# Patient Record
Sex: Male | Born: 1970 | Race: White | Hispanic: No | Marital: Married | Smoking: Never smoker
Health system: Southern US, Community
[De-identification: ages and names within clinical notes are randomized; demographics above are authoritative.]

## PROBLEM LIST (undated history)

## (undated) DIAGNOSIS — C73 Malignant neoplasm of thyroid gland: Secondary | ICD-10-CM

## (undated) HISTORY — PX: VASECTOMY: SHX75

## (undated) HISTORY — PX: KNEE SURGERY: SHX244

## (undated) HISTORY — PX: WISDOM TOOTH EXTRACTION: SHX21

---

## 2018-02-13 ENCOUNTER — Encounter (INDEPENDENT_AMBULATORY_CARE_PROVIDER_SITE_OTHER): Payer: Self-pay | Admitting: Orthopedic Surgery

## 2018-02-13 ENCOUNTER — Ambulatory Visit (INDEPENDENT_AMBULATORY_CARE_PROVIDER_SITE_OTHER): Payer: BLUE CROSS/BLUE SHIELD | Admitting: Orthopedic Surgery

## 2018-02-13 DIAGNOSIS — M79632 Pain in left forearm: Secondary | ICD-10-CM | POA: Diagnosis not present

## 2018-02-13 NOTE — Progress Notes (Signed)
Office Visit Note   [name]           Date of Birth: [birth year]           MRN: [id number] Visit Date: 02/13/2018 Requested by: No referring provider defined for this encounter. PCP: System, Pcp Not In  Subjective: Chief Complaint  Patient presents with  . Arm Pain    left forearm pain    HPI: Ricardo House is a 47 year old patient right-hand-dominant with left forearm pain for several months.  He thinks he may have injured it playing golf.  He feels like he has a muscle strain but he localizes it to the mobile wad area.  Actually is gotten little bit better since he made the appointment.  He denies any loss of dexterity denies any numbness and tingling denies any neck pain.  He has been taking some occasional over-the-counter medication for the problem.              ROS: All systems reviewed are negative as they relate to the chief complaint within the history of present illness.  Patient denies  fevers or chills.   Assessment & Plan: Visit Diagnoses:  1. Left forearm pain     Plan: Impression is muscle strain in the mobile wad area versus low level posterior interosseous nerve syndrome.  Plan is observation for now.  I think in general this is getting better.  Counseled him to avoid repetitive pronation supination type activities.  I will see him back as needed.  Could consider the full work-up of x-rays MRI and nerve study if needed.  For now his symptoms really do not warrant that by mutual agreement.  Follow-Up Instructions: Return if symptoms worsen or fail to improve.   Orders:  No orders of the defined types were placed in this encounter.  No orders of the defined types were placed in this encounter.     Procedures: No procedures performed   Clinical Data: No additional findings.  Objective: Vital Signs: There were no vitals taken for this visit.  Physical Exam:   Constitutional: Patient appears well-developed HEENT:  Head: Normocephalic Eyes:EOM are  normal Neck: Normal range of motion Cardiovascular: Normal rate Pulmonary/chest: Effort normal Neurologic: Patient is alert Skin: Skin is warm Psychiatric: Patient has normal mood and affect    Ortho Exam: Ortho exam demonstrates 5 out of 5 grip EPL FPL interosseous resection extension biceps triceps and deltoid strength.  Good cervical spine range of motion.  Patient is palpable radial pulse.  Does have a little bit of pain with resisted supination on the left but not on the right.  Little bit of tenderness just above the supinator arcade.  Negative Tinel's in the cubital tunnel on the left.  No real tenderness on the medial or lateral epicondyle.  No pain with resisted wrist extension on either side.  No masses lymphadenopathy or skin changes palpable in that left forearm area.  Specialty Comments:  No specialty comments available.  Imaging: No results found.   PMFS History: There are no active problems to display for this patient.  History reviewed. No pertinent past medical history.  History reviewed. No pertinent family history.  History reviewed. No pertinent surgical history. Social History   Occupational History  . Not on file  Tobacco Use  . Smoking status: Not on file  Substance and Sexual Activity  . Alcohol use: Not on file  . Drug use: Not on file  . Sexual activity: Not on file

## 2018-09-01 ENCOUNTER — Ambulatory Visit: Payer: Self-pay

## 2018-09-01 ENCOUNTER — Encounter: Payer: Self-pay | Admitting: Orthopedic Surgery

## 2018-09-01 ENCOUNTER — Ambulatory Visit (INDEPENDENT_AMBULATORY_CARE_PROVIDER_SITE_OTHER): Payer: 59 | Admitting: Orthopedic Surgery

## 2018-09-01 ENCOUNTER — Other Ambulatory Visit: Payer: Self-pay

## 2018-09-01 DIAGNOSIS — M25561 Pain in right knee: Secondary | ICD-10-CM | POA: Diagnosis not present

## 2018-09-01 DIAGNOSIS — M1711 Unilateral primary osteoarthritis, right knee: Secondary | ICD-10-CM

## 2018-09-04 ENCOUNTER — Encounter: Payer: Self-pay | Admitting: Orthopedic Surgery

## 2018-09-04 DIAGNOSIS — M1711 Unilateral primary osteoarthritis, right knee: Secondary | ICD-10-CM | POA: Diagnosis not present

## 2018-09-04 MED ORDER — LIDOCAINE HCL 1 % IJ SOLN
5.0000 mL | INTRAMUSCULAR | Status: AC | PRN
  Administered 2018-09-04: 5 mL

## 2018-09-04 MED ORDER — BUPIVACAINE HCL 0.25 % IJ SOLN
4.0000 mL | INTRAMUSCULAR | Status: AC | PRN
  Administered 2018-09-04: 09:00:00 4 mL via INTRA_ARTICULAR

## 2018-09-04 MED ORDER — METHYLPREDNISOLONE ACETATE 40 MG/ML IJ SUSP
40.0000 mg | INTRAMUSCULAR | Status: AC | PRN
  Administered 2018-09-04: 40 mg via INTRA_ARTICULAR

## 2018-09-04 NOTE — Progress Notes (Signed)
Office Visit Note   [name]           Date of Birth: 1970/07/30           MRN: [id number] Visit Date: 09/01/2018 Requested by: No referring provider defined for this encounter. PCP: System, Pcp Not In  Subjective: Chief Complaint  Patient presents with  . Right Knee - Pain    HPI: Ricardo House is a patient with right knee pain.  He was last seen about 5 years ago for this problem.  He has had prior arthroscopic surgery on that right knee consisting of a lateral release.  He states that about once a month it would pop and he felt like something was bound and then released within the right knee.  Does happen more frequently to the point now it is about 6 times a day.  Reports pain in the superior lateral region.  He has history of arthroscopic surgery and meniscal surgery on that knee x3 about 13 years ago.              ROS: All systems reviewed are negative as they relate to the chief complaint within the history of present illness.  Patient denies  fevers or chills.   Assessment & Plan: Visit Diagnoses:  1. Right knee pain, unspecified chronicity   2. Unilateral primary osteoarthritis, right knee     Plan: Impression is right knee mechanical symptoms and effusion with medial compartment arthritis but most of his pain is around that lateral release site superior laterally.  I think he may have some synovitis or potential mechanical blocks to motion in that region.  Is been going on for over several months.  I would aspirate and inject that knee today and then get an MRI scan of that right knee to evaluate superior lateral mechanical locking pathology.  He is going to call if he wants to get that MRI scan.  We will see how that injection does for now.  Follow-Up Instructions: Return if symptoms worsen or fail to improve.   Orders:  Orders Placed This Encounter  Procedures  . XR Knee 1-2 Views Right   No orders of the defined types were placed in this encounter.     Procedures: Large Joint Inj: R knee on 09/04/2018 9:10 AM Indications: diagnostic evaluation, joint swelling and pain Details: 18 G 1.5 in needle, superolateral approach  Arthrogram: No  Medications: 5 mL lidocaine 1 %; 40 mg methylPREDNISolone acetate 40 MG/ML; 4 mL bupivacaine 0.25 % Outcome: tolerated well, no immediate complications Procedure, treatment alternatives, risks and benefits explained, specific risks discussed. Consent was given by the patient. Immediately prior to procedure a time out was called to verify the correct patient, procedure, equipment, support staff and site/side marked as required. Patient was prepped and draped in the usual sterile fashion.       Clinical Data: No additional findings.  Objective: Vital Signs: There were no vitals taken for this visit.  Physical Exam:   Constitutional: Patient appears well-developed HEENT:  Head: Normocephalic Eyes:EOM are normal Neck: Normal range of motion Cardiovascular: Normal rate Pulmonary/chest: Effort normal Neurologic: Patient is alert Skin: Skin is warm Psychiatric: Patient has normal mood and affect    Ortho Exam: Ortho exam demonstrates full active and passive range of motion of that right knee.  Effusion is present.  He has not much in the way medial lateral joint line tenderness but does hurt some superior laterally.  Extensor mechanism is intact.  [employer]  strength is excellent.  Varus alignment is present.  Specialty Comments:  No specialty comments available.  Imaging: No results found.   PMFS History: There are no active problems to display for this patient.  History reviewed. No pertinent past medical history.  History reviewed. No pertinent family history.  History reviewed. No pertinent surgical history. Social History   Occupational History  . Not on file  Tobacco Use  . Smoking status: Not on file  Substance and Sexual Activity  . Alcohol use: Not on file  . Drug use: Not on  file  . Sexual activity: Not on file

## 2018-11-27 ENCOUNTER — Telehealth: Payer: Self-pay | Admitting: Orthopedic Surgery

## 2018-11-27 DIAGNOSIS — M25561 Pain in right knee: Secondary | ICD-10-CM

## 2018-11-27 NOTE — Telephone Encounter (Signed)
Patent called stated he and DEAN discussed that if cortisone inj didn't work they would do a MRI.He can now feel a bind and release  Please call patient as he is wanting to get MRI the nest step that was offered to him.  [phone]

## 2018-11-27 NOTE — Telephone Encounter (Signed)
S/w patient and advised MRI ordered per Dr Forbes Cellar last [facility] note

## 2018-12-28 ENCOUNTER — Other Ambulatory Visit: Payer: Self-pay

## 2018-12-28 ENCOUNTER — Ambulatory Visit
Admission: RE | Admit: 2018-12-28 | Discharge: 2018-12-28 | Disposition: A | Discharge status: Home / Self Care | Payer: 59 | Source: Ambulatory Visit | Attending: Orthopedic Surgery | Admitting: Orthopedic Surgery

## 2018-12-28 DIAGNOSIS — M25561 Pain in right knee: Secondary | ICD-10-CM

## 2019-01-03 ENCOUNTER — Ambulatory Visit (INDEPENDENT_AMBULATORY_CARE_PROVIDER_SITE_OTHER): Payer: 59 | Admitting: Orthopedic Surgery

## 2019-01-03 ENCOUNTER — Other Ambulatory Visit: Payer: Self-pay

## 2019-01-03 ENCOUNTER — Encounter: Payer: Self-pay | Admitting: Orthopedic Surgery

## 2019-01-03 DIAGNOSIS — M25561 Pain in right knee: Secondary | ICD-10-CM

## 2019-01-03 NOTE — Progress Notes (Signed)
   Office Visit Note   [name]           Date of Birth: 1970/11/06           MRN: [id number] Visit Date: 01/03/2019 Requested by: No referring provider defined for this encounter. PCP: System, Pcp Not In  Subjective: Chief Complaint  Patient presents with  . Follow-up    HPI: Ricardo House is a patient with right knee pain.  Here to follow-up MRI scan.  MRI scan shows medial compartment arthritis with prior lateral retinacular release.  There is some osteophyte over on the lateral femoral condyle which I think is giving him his mechanical symptoms.              ROS: All systems reviewed are negative as they relate to the chief complaint within the history of present illness.  Patient denies  fevers or chills.   Assessment & Plan: Visit Diagnoses: No diagnosis found.  Plan: Impression is right knee pain laterally which is likely residual lateral retinaculum clicking over osteophyte.  The patellofemoral joint itself is relatively well-preserved.  Medial compartment arthritis is present.  I would favor observation at this time.  The injection that he had previously did help him.  The binding and catching laterally has diminished.  Could consider arthroscopic intervention of that osteophyte if absolutely needed but for now he is not doing any structural damage to his knee by walking so we will follow this along  Follow-Up Instructions: Return if symptoms worsen or fail to improve.   Orders:  No orders of the defined types were placed in this encounter.  No orders of the defined types were placed in this encounter.     Procedures: No procedures performed   Clinical Data: No additional findings.  Objective: Vital Signs: There were no vitals taken for this visit.  Physical Exam:   Constitutional: Patient appears well-developed HEENT:  Head: Normocephalic Eyes:EOM are normal Neck: Normal range of motion Cardiovascular: Normal rate Pulmonary/chest: Effort normal  Neurologic: Patient is alert Skin: Skin is warm Psychiatric: Patient has normal mood and affect    Ortho Exam: Ortho exam demonstrates full active and passive range of motion of that right knee with trace effusion.  Collateral crucial ligaments are stable.  Varus alignment present.  I do not really detect any definite mechanical symptoms with every bit of knee flexion which she described occurring last week.  He does produce an occasional pop which is very mild in that right knee lateral region.  It is in the vicinity of the osteophyte which is present.  This is shown to him on the MRI scan.  Specialty Comments:  No specialty comments available.  Imaging: No results found.   PMFS History: There are no active problems to display for this patient.  History reviewed. No pertinent past medical history.  History reviewed. No pertinent family history.  History reviewed. No pertinent surgical history. Social History   Occupational History  . Not on file  Tobacco Use  . Smoking status: Never Smoker  . Smokeless tobacco: Never Used  Substance and Sexual Activity  . Alcohol use: Not on file  . Drug use: Not on file  . Sexual activity: Not on file

## 2019-05-07 ENCOUNTER — Other Ambulatory Visit: Payer: 59

## 2019-05-10 ENCOUNTER — Ambulatory Visit: Payer: 59 | Attending: Internal Medicine

## 2019-05-10 DIAGNOSIS — Z20822 Contact with and (suspected) exposure to covid-19: Secondary | ICD-10-CM

## 2019-05-12 LAB — NOVEL CORONAVIRUS, NAA: SARS-CoV-2, NAA: NOT DETECTED

## 2019-06-01 ENCOUNTER — Ambulatory Visit: Payer: 59 | Attending: Internal Medicine

## 2019-06-01 DIAGNOSIS — Z20822 Contact with and (suspected) exposure to covid-19: Secondary | ICD-10-CM

## 2019-06-02 LAB — NOVEL CORONAVIRUS, NAA: SARS-CoV-2, NAA: NOT DETECTED

## 2019-08-01 ENCOUNTER — Ambulatory Visit: Payer: 59 | Attending: Internal Medicine

## 2019-08-01 DIAGNOSIS — Z20822 Contact with and (suspected) exposure to covid-19: Secondary | ICD-10-CM

## 2019-08-02 LAB — NOVEL CORONAVIRUS, NAA: SARS-CoV-2, NAA: NOT DETECTED

## 2019-08-02 LAB — SARS-COV-2, NAA 2 DAY TAT

## 2019-09-17 IMAGING — MR MR KNEE*R* W/O CM
6 series · 40 of 40 positions shown · non-contrast
Comparison: Radiographs 09/01/2018.  MRI 09/11/2011.

CLINICAL DATA: Lateral knee pain for 6 months. No recent injury.
Knee surgery in 7995 following sports injury.

EXAM:
MRI OF THE RIGHT KNEE WITHOUT CONTRAST
TECHNIQUE: Multiplanar, multisequence MR imaging of the knee was performed. No
intravenous contrast was administered.

[Series 6: T2 fat-sat · axial · right · 4.0mm · 0.53mm/px · z∈[-44,+110]mm · 8 of 36 slices shown (1 of 3)]
[im 1/36]
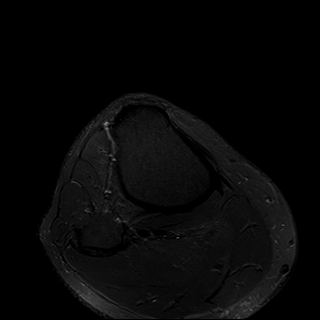
[im 6/36]
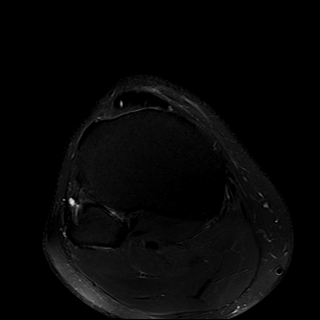
[im 11/36]
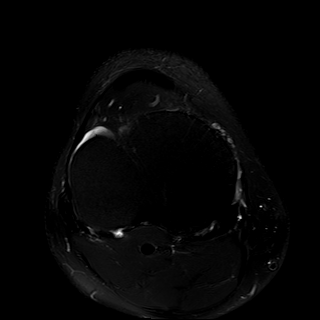
[im 16/36]
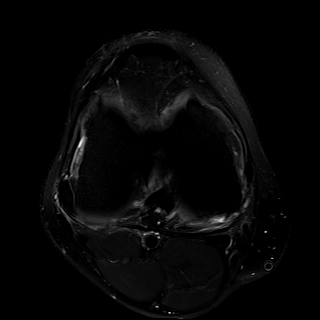
[im 21/36]
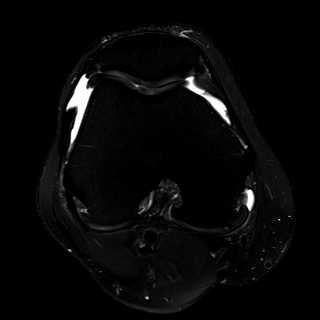
[im 26/36]
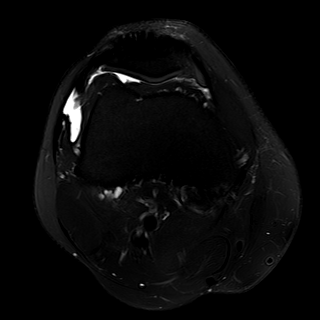
[im 31/36]
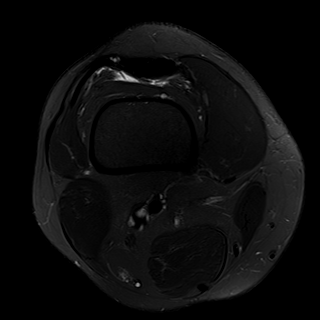
[im 36/36]
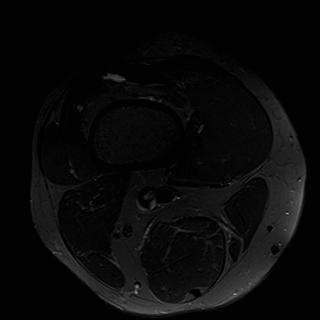

[Series 7: T2 fat-sat · coronal · right · 4.0mm · 0.42mm/px · 6 of 30 slices shown (2 of 3)]
[im 1/30]
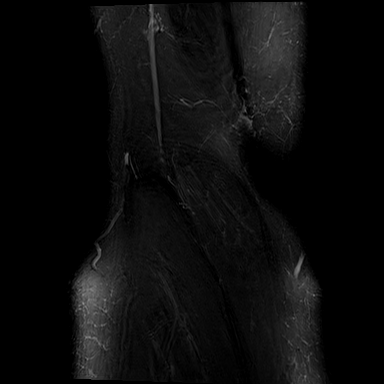
[im 6/30]
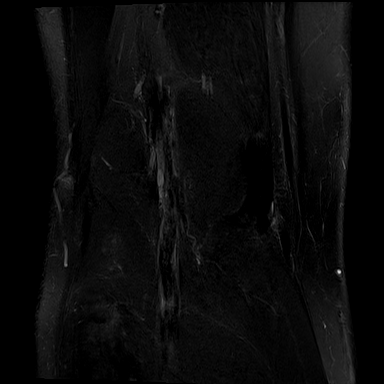
[im 12/30]
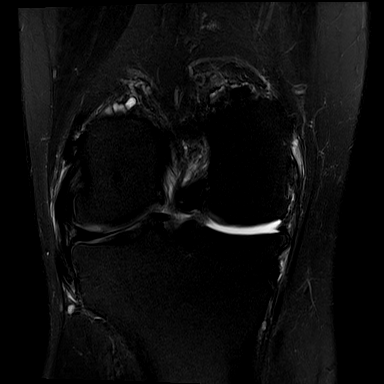
[im 18/30]
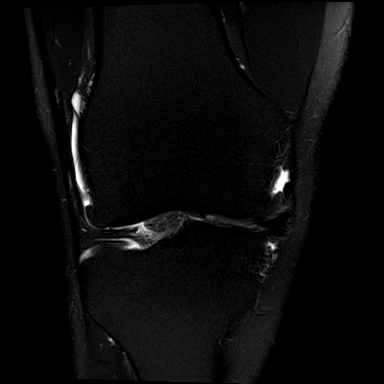
[im 24/30]
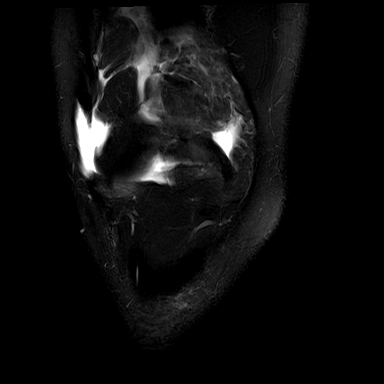
[im 30/30]
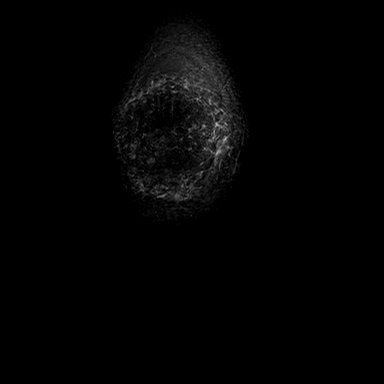

[Series 9: PD fat-sat · coronal · right · 3.0mm · 0.50mm/px · 8 of 36 slices shown (1 of 2)]
[im 1/36]
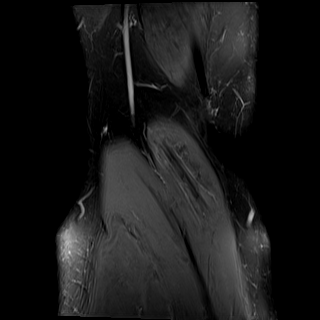
[im 6/36]
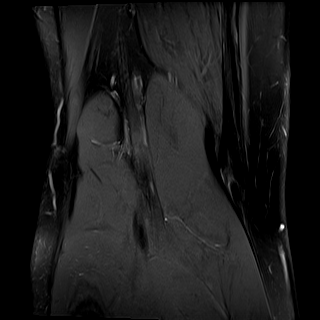
[im 11/36]
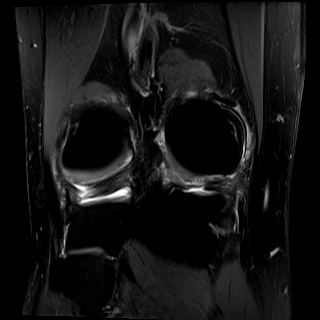
[im 16/36]
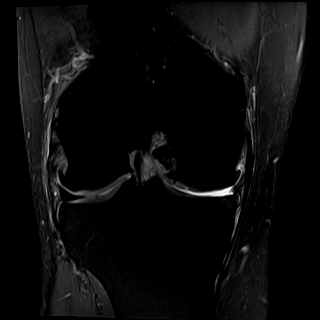
[im 21/36]
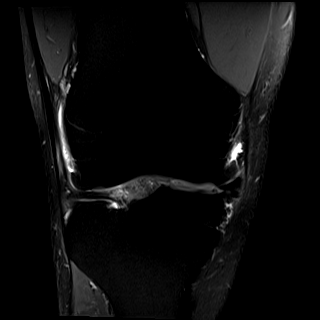
[im 26/36]
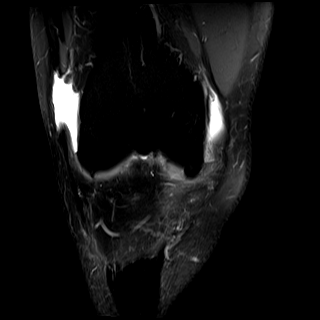
[im 31/36]
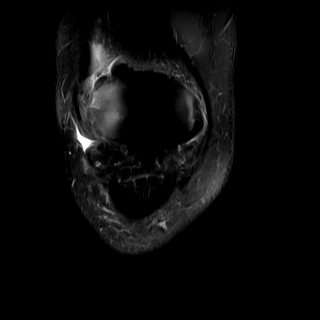
[im 36/36]
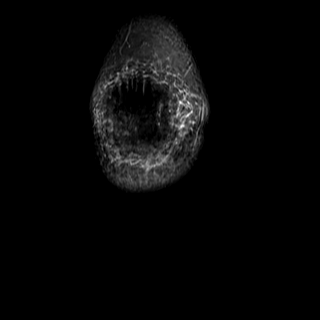

[Series 10: T1 · coronal · right · 4.0mm · 0.42mm/px · 6 of 30 slices shown]
[im 1/30]
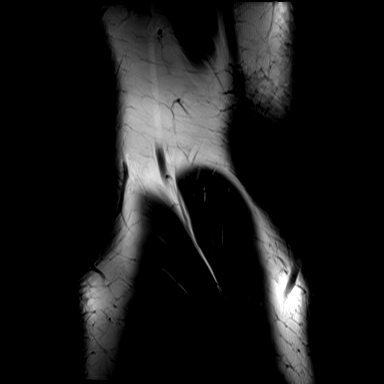
[im 6/30]
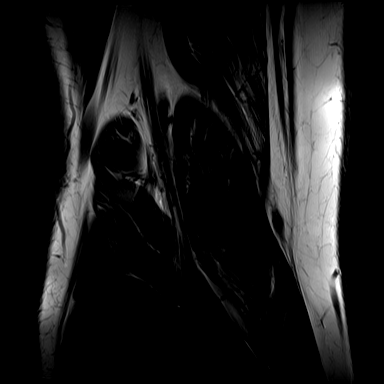
[im 12/30]
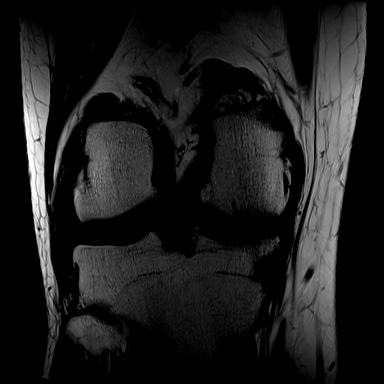
[im 18/30]
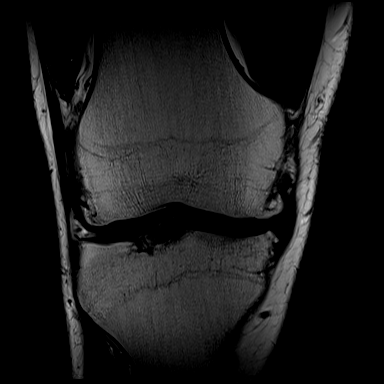
[im 24/30]
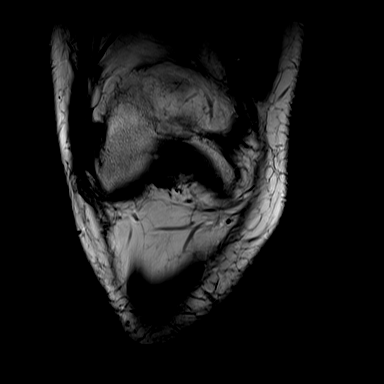
[im 30/30]
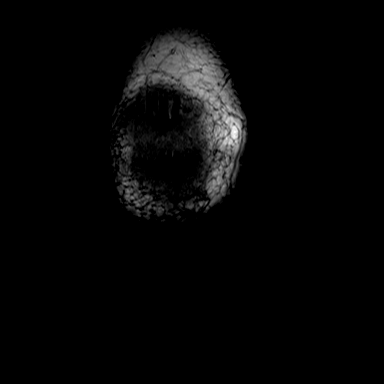

[Series 11: PD fat-sat · sagittal · right · 3.3mm · 0.50mm/px · 6 of 28 slices shown (2 of 2)]
[im 1/28]
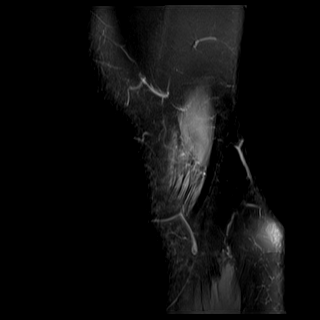
[im 6/28]
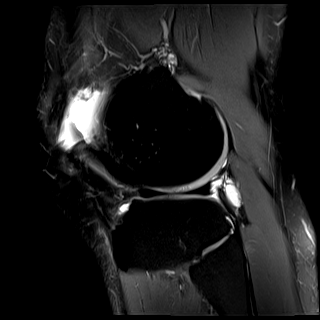
[im 11/28]
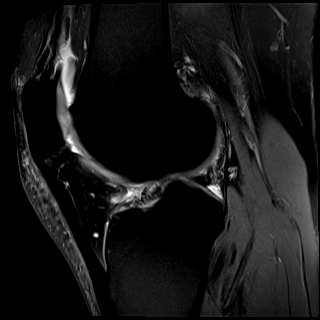
[im 17/28]
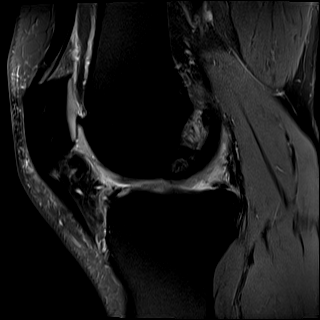
[im 22/28]
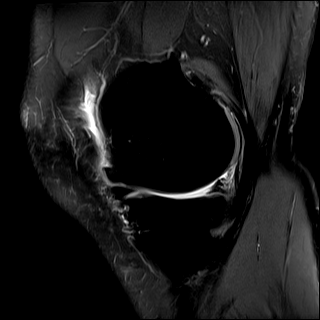
[im 28/28]
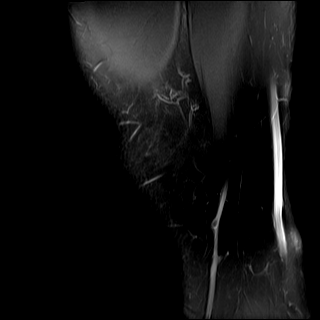

[Series 12: T2 fat-sat · sagittal · right · 3.3mm · 0.50mm/px · 6 of 28 slices shown (3 of 3)]
[im 1/28]
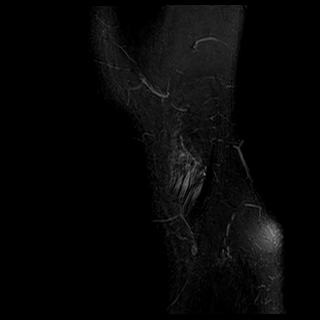
[im 6/28]
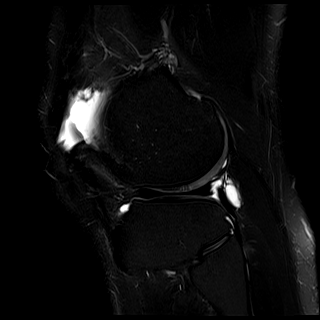
[im 11/28]
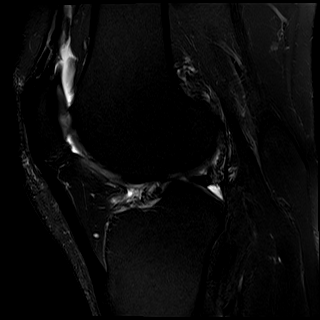
[im 17/28]
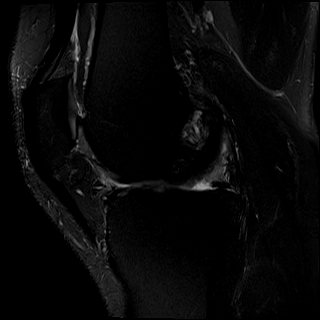
[im 22/28]
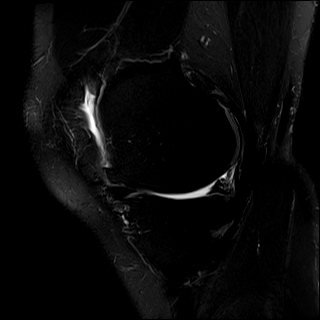
[im 28/28]
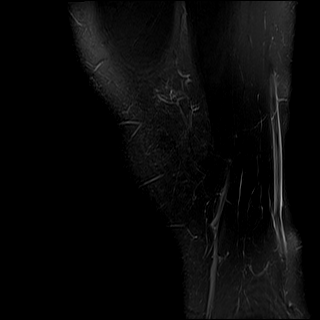

[40 of 40 positions shown; findings below may reference images not displayed]

FINDINGS: MENISCI

Medial meniscus: Stable postsurgical changes status post partial
meniscectomy. The posterior horn and body are diminutive with free
edge irregularity, but no discrete recurrent meniscal tear or
displaced meniscal fragment.

Lateral meniscus:  Intact with normal morphology.

LIGAMENTS

Cruciates:  Intact.

Collaterals: Intact. Probable postsurgical changes in the iliotibial
band and lateral patellar retinaculum, unchanged from previous
study.

CARTILAGE

Patellofemoral: Central trochlear chondromalacia with chondral
thinning and surface irregularity.

Medial: Progressive medial compartment osteoarthritis with chondral
thinning, surface irregularity and osteophyte formation. There is
mild subchondral edema peripherally in the medial tibial plateau.

Lateral: Mildly progressive peripheral osteophyte formation. No
focal chondral defect.

MISCELLANEOUS

Joint:  Small joint effusion.  No evidence of loose body.

Popliteal Fossa: No significant Baker's cyst. A small amount of
fluid is present in the popliteus hiatus. There is a new septated
cyst along the posteromedial aspect of the medial femoral condyle,
measuring up to 2.5 cm on sagittal image [DATE]. Likely ganglion.

Extensor Mechanism:  Intact.

Bones:  No acute or significant extra-articular osseous findings.

Other: No other significant periarticular soft tissue findings.
IMPRESSION: 1. Progressive tricompartmental degenerative changes since 0199,
most advanced medial compartment. No acute osseous findings.
2. Stable postsurgical changes within the medial meniscus. The
lateral meniscus appears normal.
3. Probable previous lateral retinacular release.
4. Septated ganglion along the posteromedial aspect of the medial
femoral condyle.

## 2019-11-01 ENCOUNTER — Other Ambulatory Visit: Payer: Self-pay | Admitting: Family Medicine

## 2019-11-01 ENCOUNTER — Other Ambulatory Visit: Payer: Self-pay

## 2019-11-01 DIAGNOSIS — E041 Nontoxic single thyroid nodule: Secondary | ICD-10-CM

## 2019-11-06 ENCOUNTER — Other Ambulatory Visit (HOSPITAL_COMMUNITY)
Admission: RE | Admit: 2019-11-06 | Discharge: 2019-11-06 | Disposition: A | Discharge status: Home / Self Care | Payer: Managed Care, Other (non HMO) | Source: Ambulatory Visit | Attending: Family Medicine | Admitting: Family Medicine

## 2019-11-06 ENCOUNTER — Ambulatory Visit
Admission: RE | Admit: 2019-11-06 | Discharge: 2019-11-06 | Disposition: A | Discharge status: Home / Self Care | Payer: Managed Care, Other (non HMO) | Source: Ambulatory Visit | Attending: Family Medicine | Admitting: Family Medicine

## 2019-11-06 DIAGNOSIS — E041 Nontoxic single thyroid nodule: Secondary | ICD-10-CM

## 2019-11-08 LAB — CYTOLOGY - NON PAP

## 2019-11-20 ENCOUNTER — Ambulatory Visit: Payer: Self-pay | Admitting: Surgery

## 2019-12-05 ENCOUNTER — Ambulatory Visit (INDEPENDENT_AMBULATORY_CARE_PROVIDER_SITE_OTHER): Payer: BC Managed Care – PPO | Admitting: Orthopedic Surgery

## 2019-12-05 DIAGNOSIS — M1711 Unilateral primary osteoarthritis, right knee: Secondary | ICD-10-CM

## 2019-12-07 ENCOUNTER — Encounter: Payer: Self-pay | Admitting: Orthopedic Surgery

## 2019-12-07 DIAGNOSIS — M1711 Unilateral primary osteoarthritis, right knee: Secondary | ICD-10-CM | POA: Diagnosis not present

## 2019-12-07 MED ORDER — BUPIVACAINE HCL 0.25 % IJ SOLN
4.0000 mL | INTRAMUSCULAR | Status: AC | PRN
  Administered 2019-12-07: 4 mL via INTRA_ARTICULAR

## 2019-12-07 MED ORDER — METHYLPREDNISOLONE ACETATE 40 MG/ML IJ SUSP
40.0000 mg | INTRAMUSCULAR | Status: AC | PRN
  Administered 2019-12-07: 40 mg via INTRA_ARTICULAR

## 2019-12-07 MED ORDER — LIDOCAINE HCL 1 % IJ SOLN
5.0000 mL | INTRAMUSCULAR | Status: AC | PRN
  Administered 2019-12-07: 5 mL

## 2019-12-07 NOTE — Progress Notes (Signed)
Office Visit Note   [name]           Date of Birth: [birth year]           MRN: [id number] Visit Date: 12/05/2019 Requested by: No referring provider defined for this encounter. PCP: System, Pcp Not In  Subjective: Chief Complaint  Patient presents with  . Right Knee - Pain    HPI: Ricardo House is a 49 year old patient with right knee pain. Known history of right knee medial and patellofemoral compartment arthritis. Ports increased pain. No recurrent injury. Requesting aspiration and injection today. Pain has been going on for 3 weeks. Takes over-the-counter medication as needed. Denies any mechanical symptoms.              ROS: All systems reviewed are negative as they relate to the chief complaint within the history of present illness.  Patient denies  fevers or chills.   Assessment & Plan: Visit Diagnoses:  1. 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 He is developing more of a flexion contracture on the right-hand side. Varus alignment is present. Arthritis is progressing. Aspiration and injection performed today. Continue with nonweightbearing quad strengthening exercises. Follow-up as needed. Over-the-counter anti-inflammatories as needed for pain which she is using.  Follow-Up Instructions: Return if symptoms worsen or fail to improve.   Orders:  No orders of the defined types were placed in this encounter.  No orders of the defined types were placed in this encounter.     Procedures: Large Joint Inj: R knee on 12/07/2019 8:06 PM Indications: diagnostic evaluation, joint swelling and pain Details: 18 G 1.5 in needle, superolateral approach  Arthrogram: No  Medications: 5 mL lidocaine 1 %; 40 mg methylPREDNISolone acetate 40 MG/ML; 4 mL bupivacaine 0.25 % Outcome: tolerated well, no immediate complications Procedure, treatment  alternatives, risks and benefits explained, specific risks discussed. Consent was given by the patient. Immediately prior to procedure a time out was called to verify the correct patient, procedure, equipment, support staff and site/side marked as required. Patient was prepped and draped in the usual sterile fashion.       Clinical Data: No additional findings.  Objective: Vital Signs: There were no vitals taken for this visit.  Physical Exam:   Constitutional: Patient appears well-developed HEENT:  Head: Normocephalic Eyes:EOM are normal Neck: Normal range of motion Cardiovascular: Normal rate Pulmonary/chest: Effort normal Neurologic: Patient is alert Skin: Skin is warm Psychiatric: Patient has normal mood and affect    Ortho Exam: Ortho exam demonstrates full active and passive range of motion of the left knee. Varus alignment is present. On the right-hand side he is got about a 10 degree flexion contracture but can bend it to about 115. Collateral and cruciate ligaments are stable. Effusion is present. Extensor mechanism is intact and nontender.  Specialty Comments:  No specialty comments available.  Imaging: No results found.   PMFS History: There are no problems to display for this patient.  History reviewed. No pertinent past medical history.  History reviewed. No pertinent family history.  History reviewed. No pertinent surgical history. Social History   Occupational History  . Not on file  Tobacco Use  . Smoking status: Never Smoker  . Smokeless tobacco: Never Used  Substance and Sexual Activity  . Alcohol use: Not on file  . Drug use: Not on file  . Sexual  activity: Not on file

## 2019-12-26 NOTE — Progress Notes (Addendum)
COVID Vaccine Completed:  X2 Date COVID Vaccine completed:  07-26-19 COVID vaccine manufacturer: Pfizer    Moderna   [employer]   PCP - Donnie Coffin, MD Cardiologist -   Chest x-ray -  EKG -  Stress Test -  ECHO -  Cardiac Cath -   Sleep Study -  CPAP -   Fasting Blood Sugar -  Checks Blood Sugar _____ times a day  Blood Thinner Instructions: Aspirin Instructions: Last Dose:  Anesthesia review:   Patient denies shortness of breath, fever, cough and chest pain at PAT appointment   Patient verbalized understanding of instructions that were given to them at the PAT appointment. Patient was also instructed that they will need to review over the PAT instructions again at home before surgery.

## 2019-12-26 NOTE — Patient Instructions (Addendum)
DUE TO COVID-19 ONLY ONE VISITOR IS ALLOWED TO COME WITH YOU AND STAY IN THE WAITING ROOM ONLY DURING  PRE OP AND PROCEDURE.   IF YOU WILL BE ADMITTED INTO THE HOSPITAL YOU ARE ALLOWED ONE SUPPORT PERSON DURING VISITATION HOURS  ONLY (10AM -8PM)   . The support person may change daily. . The support person must pass our screening, gel in and out, and wear a mask at all times, including in the patient's room. . Patients must also wear a mask when staff or their support person are in the room.   COVID SWAB TESTING MUST BE COMPLETED ON:  Saturday, 12-29-19 @ 10:10 AM    [address] [place], [place] [address]  (Must self quarantine after testing. Follow instructions on handout.)        Your procedure is scheduled on:  Wednesday, 01-02-20   Report to [facility] Main  Entrance    Report to admitting at 6:30 AM   Call this number if you have problems the morning of surgery [phone]   Do not eat food :After Midnight.   May have liquids until 5:30 AM  day of surgery   CLEAR LIQUID DIET  Foods Allowed                                                                     Foods Excluded  Water, Black Coffee and tea, regular and decaf              liquids that you cannot  Plain Jell-O in any flavor  (No red)                                     see through such as: Fruit ices (not with fruit pulp)                                      milk, soups, orange juice              Iced Popsicles (No red)                                      All solid food                                   Apple juices Sports drinks like Gatorade (No red) Lightly seasoned clear broth or consume(fat free) Sugar, honey syrup      Oral Hygiene is also important to reduce your risk of infection.                                     Remember - BRUSH YOUR TEETH THE MORNING OF SURGERY WITH YOUR REGULAR TOOTHPASTE   Do NOT smoke after Midnight   Take these medicines the morning of surgery with A SIP OF  WATER:  None  You may not have any metal on your body including jewelry, and body piercings             Do not wear lotions, powders, perfumes/cologne, or deodorant             Men may shave face and neck.   Do not bring valuables to the hospital. [place].   Contacts, dentures or bridgework may not be worn into surgery.   Bring small overnight bag day of surgery.                 Please read over the following fact sheets you were given: IF YOU HAVE QUESTIONS ABOUT YOUR PRE OP INSTRUCTIONS PLEASE CALL [phone]   [facility] - Preparing for Surgery Before surgery, you can play an important role.  Because skin is not sterile, your skin needs to be as free of germs as possible.  You can reduce the number of germs on your skin by washing with CHG (chlorahexidine gluconate) soap before surgery.  CHG is an antiseptic cleaner which kills germs and bonds with the skin to continue killing germs even after washing. Please DO NOT use if you have an allergy to CHG or antibacterial soaps.  If your skin becomes reddened/irritated stop using the CHG and inform your nurse when you arrive at Short Stay. Do not shave (including legs and underarms) for at least 48 hours prior to the first CHG shower.  You may shave your face/neck.  Please follow these instructions carefully:  1.  Shower with CHG Soap the night before surgery and the  morning of surgery.  2.  If you choose to wash your hair, wash your hair first as usual with your normal  shampoo.  3.  After you shampoo, rinse your hair and body thoroughly to remove the shampoo.                             4.  Use CHG as you would any other liquid soap.  You can apply chg directly to the skin and wash.  Gently with a scrungie or clean washcloth.  5.  Apply the CHG Soap to your body ONLY FROM THE NECK DOWN.   Do   not use on face/ open                           Wound or open sores. Avoid  contact with eyes, ears mouth and   genitals (private parts).                       Wash face,  Genitals (private parts) with your normal soap.             6.  Wash thoroughly, paying special attention to the area where your    surgery  will be performed.  7.  Thoroughly rinse your body with warm water from the neck down.  8.  DO NOT shower/wash with your normal soap after using and rinsing off the CHG Soap.                9.  Pat yourself dry with a clean towel.            10.  Wear clean pajamas.            11.  Place clean sheets on  your bed the night of your first shower and do not  sleep with pets. Day of Surgery : Do not apply any lotions/deodorants the morning of surgery.  Please wear clean clothes to the hospital/surgery center.  FAILURE TO FOLLOW THESE INSTRUCTIONS MAY RESULT IN THE CANCELLATION OF YOUR SURGERY  PATIENT SIGNATURE_________________________________  NURSE SIGNATURE__________________________________  ________________________________________________________________________

## 2019-12-28 ENCOUNTER — Ambulatory Visit (HOSPITAL_COMMUNITY)
Admission: RE | Admit: 2019-12-28 | Discharge: 2019-12-28 | Disposition: A | Discharge status: Home / Self Care | Payer: BC Managed Care – PPO | Source: Ambulatory Visit | Attending: Anesthesiology | Admitting: Anesthesiology

## 2019-12-28 ENCOUNTER — Other Ambulatory Visit: Payer: Self-pay

## 2019-12-28 ENCOUNTER — Encounter (HOSPITAL_COMMUNITY): Admission: RE | Admit: 2019-12-28 | Payer: BC Managed Care – PPO | Source: Ambulatory Visit

## 2019-12-28 ENCOUNTER — Encounter (HOSPITAL_COMMUNITY)
Admission: RE | Admit: 2019-12-28 | Discharge: 2019-12-28 | Disposition: A | Discharge status: Home / Self Care | Payer: BC Managed Care – PPO | Source: Ambulatory Visit | Attending: Surgery | Admitting: Surgery

## 2019-12-28 ENCOUNTER — Encounter (HOSPITAL_COMMUNITY): Payer: Self-pay

## 2019-12-28 DIAGNOSIS — Z01818 Encounter for other preprocedural examination: Secondary | ICD-10-CM | POA: Insufficient documentation

## 2019-12-28 HISTORY — DX: Malignant neoplasm of thyroid gland: C73

## 2019-12-28 LAB — CBC
HCT: 39.3 % (ref 39.0–52.0)
Hemoglobin: 13.5 g/dL (ref 13.0–17.0)
MCH: 32.1 pg (ref 26.0–34.0)
MCHC: 34.4 g/dL (ref 30.0–36.0)
MCV: 93.3 fL (ref 80.0–100.0)
Platelets: 302 10*3/uL (ref 150–400)
RBC: 4.21 MIL/uL — ABNORMAL LOW (ref 4.22–5.81)
RDW: 12.1 % (ref 11.5–15.5)
WBC: 7.9 10*3/uL (ref 4.0–10.5)
nRBC: 0 % (ref 0.0–0.2)

## 2019-12-29 ENCOUNTER — Other Ambulatory Visit (HOSPITAL_COMMUNITY)
Admission: RE | Admit: 2019-12-29 | Discharge: 2019-12-29 | Disposition: A | Discharge status: Home / Self Care | Payer: BC Managed Care – PPO | Source: Ambulatory Visit | Attending: Surgery | Admitting: Surgery

## 2019-12-29 DIAGNOSIS — Z20822 Contact with and (suspected) exposure to covid-19: Secondary | ICD-10-CM | POA: Diagnosis not present

## 2019-12-29 DIAGNOSIS — Z01812 Encounter for preprocedural laboratory examination: Secondary | ICD-10-CM | POA: Insufficient documentation

## 2019-12-29 LAB — SARS CORONAVIRUS 2 (TAT 6-24 HRS): SARS Coronavirus 2: NEGATIVE

## 2020-01-01 ENCOUNTER — Encounter (HOSPITAL_COMMUNITY): Payer: Self-pay | Admitting: Surgery

## 2020-01-01 DIAGNOSIS — C73 Malignant neoplasm of thyroid gland: Secondary | ICD-10-CM | POA: Diagnosis present

## 2020-01-01 NOTE — Anesthesia Preprocedure Evaluation (Addendum)
Anesthesia Evaluation  Patient identified by MRN, date of birth, ID band Patient awake    Reviewed: Allergy & Precautions, NPO status , Patient's Chart, lab work & pertinent test results  Airway Mallampati: II  TM Distance: >3 FB Neck ROM: Full    Dental  (+) Teeth Intact   Pulmonary neg pulmonary ROS,    Pulmonary exam normal breath sounds clear to auscultation       Cardiovascular Exercise Tolerance: Good negative cardio ROS Normal cardiovascular exam Rhythm:Regular Rate:Normal     Neuro/Psych negative neurological ROS     GI/Hepatic negative GI ROS, Neg liver ROS,   Endo/Other  Thyroid cancer Obesity   Renal/GU negative Renal ROS     Musculoskeletal negative musculoskeletal ROS (+)   Abdominal   Peds  Hematology negative hematology ROS (+)   Anesthesia Other Findings   Reproductive/Obstetrics                            Anesthesia Physical Anesthesia Plan  ASA: II  Anesthesia Plan: General   Post-op Pain Management:    Induction: Intravenous  PONV Risk Score and Plan: 3 and Midazolam, Ondansetron and Dexamethasone  Airway Management Planned: Oral ETT  Additional Equipment:   Intra-op Plan:   Post-operative Plan: Extubation in OR  Informed Consent: I have reviewed the patients History and Physical, chart, labs and discussed the procedure including the risks, benefits and alternatives for the proposed anesthesia with the patient or authorized representative who has indicated his/her understanding and acceptance.       Plan Discussed with: CRNA  Anesthesia Plan Comments:        Anesthesia Quick Evaluation

## 2020-01-01 NOTE — H&P (Signed)
General Surgery [facility]  Tion Tse DOB: April 27, 1970 Married / Language: English / Race: White Male   History of Present Illness   The patient is a 49 year old male who presents with thyroid cancer.  CHIEF COMPLAINT: papillary thyroid carcinoma  Patient is referred by Dr. Donnie Coffin for surgical evaluation and management of newly diagnosed papillary thyroid carcinoma. Patient had presented to his primary care physician for evaluation of numbness in the right hand. On physical examination he was noted to have a right neck mass. This was a new finding. Patient was sent for ultrasound examination performed on October 27, 2019. This demonstrated an enlarged thyroid gland with a dominant mass in the right thyroid lobe measuring 5.5 x 3.2 x 2.9 cm. Fine-needle aspiration biopsy was recommended and was performed on November 06, 2019. This showed findings consistent with papillary thyroid carcinoma, Bethesda category VI. Patient is now referred for surgical consultation for management. Patient has no prior history of thyroid disease. He has never been on thyroid medication. His TSH level on October 25, 2019 was normal at 1.28. Patient has had no prior head or neck surgery. There is a family history of hypothyroidism and the patient's mother. There is no family history of other endocrine neoplasms. Patient denies any compressive symptoms. He presents today accompanied by his wife.   Past Surgical History Knee Surgery  Bilateral. Vasectomy   Diagnostic Studies History  Colonoscopy  never  Allergies No Known Drug Allergies  Medication History  Glucosamine Chondroitin Complx (Oral) Active. Xyzal Allergy 24HR (5MG  Tablet, Oral) Active. Medications Reconciled  Social History  Alcohol use  Occasional alcohol use. Caffeine use  Coffee. Illicit drug use  Prefer to discuss with provider. Tobacco use  Never smoker.  Family History Depression   Mother. Hypertension  Mother. Migraine Headache  Mother. Thyroid problems  Mother.  Other Problems Back Pain  Migraine Headache   Review of Systems General Not Present- Appetite Loss, Chills, Fatigue, Fever, Night Sweats, Weight Gain and Weight Loss. Skin Not Present- Change in Wart/Mole, Dryness, Hives, Jaundice, New Lesions, Non-Healing Wounds, Rash and Ulcer. HEENT Present- Seasonal Allergies. Not Present- Earache, Hearing Loss, Hoarseness, Nose Bleed, Oral Ulcers, Ringing in the Ears, Sinus Pain, Sore Throat, Visual Disturbances, Wears glasses/contact lenses and Yellow Eyes. Respiratory Not Present- Bloody sputum, Chronic Cough, Difficulty Breathing, Snoring and Wheezing. Breast Not Present- Breast Mass, Breast Pain, Nipple Discharge and Skin Changes. Cardiovascular Not Present- Chest Pain, Difficulty Breathing Lying Down, Leg Cramps, Palpitations, Rapid Heart Rate, Shortness of Breath and Swelling of Extremities. Gastrointestinal Not Present- Abdominal Pain, Bloating, Bloody Stool, Change in Bowel Habits, Chronic diarrhea, Constipation, Difficulty Swallowing, Excessive gas, Gets full quickly at meals, Hemorrhoids, Indigestion, Nausea, Rectal Pain and Vomiting. Male Genitourinary Not Present- Blood in Urine, Change in Urinary Stream, Frequency, Impotence, Nocturia, Painful Urination, Urgency and Urine Leakage. Musculoskeletal Not Present- Back Pain, Joint Pain, Joint Stiffness, Muscle Pain, Muscle Weakness and Swelling of Extremities. Neurological Present- Numbness and Tingling. Not Present- Decreased Memory, Fainting, Headaches, Seizures, Tremor, Trouble walking and Weakness. Psychiatric Not Present- Anxiety, Bipolar, Change in Sleep Pattern, Depression, Fearful and Frequent crying. Endocrine Not Present- Cold Intolerance, Excessive Hunger, Hair Changes, Heat Intolerance, Hot flashes and New Diabetes. Hematology Not Present- Blood Thinners, Easy Bruising, Excessive bleeding, Gland  problems, HIV and Persistent Infections.  Vitals  Weight: 263.4 lb Height: 78in Body Surface Area: 2.54 m Body Mass Index: 30.44 kg/m  Temp.: 98.55F (Thermal Scan)  Pulse: 96 (Regular)  BP:  128/72(Sitting, Right Arm, Standard)  Physical Exam  GENERAL APPEARANCE Development: normal Nutritional status: normal Gross deformities: none  SKIN Rash, lesions, ulcers: none Induration, erythema: none Nodules: none palpable  EYES Conjunctiva and lids: normal Pupils: equal and reactive Iris: normal bilaterally  EARS, NOSE, MOUTH, THROAT External ears: no lesion or deformity External nose: no lesion or deformity Hearing: grossly normal Due to Covid-19 pandemic, patient is wearing a mask.  NECK Symmetric: no Trachea: midline Thyroid: On extension, there is a visible mass in the right neck. On palpation, this is moderately firm, smooth, mobile, and nontender. It measures approximately 5-6 cm in greatest dimension and extends slightly beneath the right clavicle. Left thyroid lobe is slightly nodular without discrete or dominant mass. There is no palpable associated lymphadenopathy.  CHEST Respiratory effort: normal Retraction or accessory muscle use: no Breath sounds: normal bilaterally Rales, rhonchi, wheeze: none  CARDIOVASCULAR Auscultation: regular rhythm, normal rate Murmurs: none Pulses: radial pulse 2+ palpable Lower extremity edema: none  MUSCULOSKELETAL Station and gait: normal Digits and nails: no clubbing or cyanosis Muscle strength: grossly normal all extremities Range of motion: grossly normal all extremities Deformity: none  LYMPHATIC Cervical: none palpable Supraclavicular: none palpable  PSYCHIATRIC Oriented to person, place, and time: yes Mood and affect: normal for situation Judgment and insight: appropriate for situation    Assessment & Plan  PAPILLARY THYROID CARCINOMA (C73)  Patient is referred by his primary care physician for  surgical evaluation and management of newly diagnosed papillary thyroid carcinoma.  Patient provided with a copy of "The Thyroid Book: Medical and Surgical Treatment of Thyroid Problems", published by [employer], 16 pages. Book reviewed and explained to patient during visit today.  Patient has a newly diagnosed papillary thyroid carcinoma arising in the right thyroid lobe measuring 5.5 cm in greatest dimension. We reviewed his ultrasound report as well as his cytopathology results and I provided him with copies of these studies. I have recommended proceeding with total thyroidectomy with limited central compartment lymph node dissection as the procedure of choice. Patient will almost certainly require postoperative radioactive iodine treatment. We discussed the risk and benefits of surgery. We discussed the location and size of the surgical incision. We discussed the risk of injury to recurrent laryngeal nerves and parathyroid glands. We discussed the need for lifelong thyroid hormone replacement. We discussed the likely need for radioactive iodine treatment. We discussed the hospital stay to be anticipated as well as his postoperative recovery. The patient and his wife understand and wish to proceed with surgery in the near future.  The risks and benefits of the procedure have been discussed at length with the patient. The patient understands the proposed procedure, potential alternative treatments, and the course of recovery to be expected. All of the patient's questions have been answered at this time. The patient wishes to proceed with surgery.  Armandina Gemma, MD [facility] Office: [phone]

## 2020-01-02 ENCOUNTER — Ambulatory Visit (HOSPITAL_COMMUNITY): Payer: BC Managed Care – PPO | Admitting: Anesthesiology

## 2020-01-02 ENCOUNTER — Encounter (HOSPITAL_COMMUNITY)
Admission: RE | Disposition: A | Discharge status: Home / Self Care | Payer: Self-pay | Source: Home / Self Care | Attending: Surgery

## 2020-01-02 ENCOUNTER — Ambulatory Visit (HOSPITAL_COMMUNITY)
Admission: RE | Admit: 2020-01-02 | Discharge: 2020-01-03 | Disposition: A | Discharge status: Home / Self Care | Payer: BC Managed Care – PPO | Attending: Surgery | Admitting: Surgery

## 2020-01-02 ENCOUNTER — Encounter (HOSPITAL_COMMUNITY): Payer: Self-pay | Admitting: Surgery

## 2020-01-02 DIAGNOSIS — C73 Malignant neoplasm of thyroid gland: Secondary | ICD-10-CM | POA: Diagnosis present

## 2020-01-02 DIAGNOSIS — E669 Obesity, unspecified: Secondary | ICD-10-CM | POA: Insufficient documentation

## 2020-01-02 DIAGNOSIS — Z6830 Body mass index (BMI) 30.0-30.9, adult: Secondary | ICD-10-CM | POA: Insufficient documentation

## 2020-01-02 HISTORY — PX: THYROIDECTOMY: SHX17

## 2020-01-02 SURGERY — THYROIDECTOMY
Anesthesia: General | Site: Neck

## 2020-01-02 MED ORDER — SUGAMMADEX SODIUM 200 MG/2ML IV SOLN
INTRAVENOUS | Status: DC | PRN
  Administered 2020-01-02: 200 mg via INTRAVENOUS

## 2020-01-02 MED ORDER — LABETALOL HCL 5 MG/ML IV SOLN
10.0000 mg | INTRAVENOUS | Status: DC | PRN
  Administered 2020-01-02: 10 mg via INTRAVENOUS

## 2020-01-02 MED ORDER — ONDANSETRON HCL 4 MG/2ML IJ SOLN
INTRAMUSCULAR | Status: DC | PRN
  Administered 2020-01-02: 4 mg via INTRAVENOUS

## 2020-01-02 MED ORDER — HYDROMORPHONE HCL 1 MG/ML IJ SOLN
INTRAMUSCULAR | Status: AC
  Filled 2020-01-02: qty 1

## 2020-01-02 MED ORDER — ACETAMINOPHEN 650 MG RE SUPP: 650.0000 mg | Freq: Four times a day (QID) | RECTAL | Status: DC | PRN

## 2020-01-02 MED ORDER — HYDRALAZINE HCL 20 MG/ML IJ SOLN
INTRAMUSCULAR | Status: AC
  Filled 2020-01-02: qty 1

## 2020-01-02 MED ORDER — ONDANSETRON HCL 4 MG/2ML IJ SOLN: 4.0000 mg | Freq: Four times a day (QID) | INTRAMUSCULAR | Status: DC | PRN

## 2020-01-02 MED ORDER — ACETAMINOPHEN 325 MG PO TABS: 650.0000 mg | ORAL_TABLET | Freq: Four times a day (QID) | ORAL | Status: DC | PRN

## 2020-01-02 MED ORDER — TRAMADOL HCL 50 MG PO TABS
50.0000 mg | ORAL_TABLET | Freq: Four times a day (QID) | ORAL | Status: DC | PRN
  Administered 2020-01-02: 50 mg via ORAL
  Filled 2020-01-02: qty 1

## 2020-01-02 MED ORDER — KETOROLAC TROMETHAMINE 15 MG/ML IJ SOLN
INTRAMUSCULAR | Status: AC
  Filled 2020-01-02: qty 1

## 2020-01-02 MED ORDER — PROPOFOL 10 MG/ML IV BOLUS
INTRAVENOUS | Status: DC | PRN
  Administered 2020-01-02: 200 mg via INTRAVENOUS

## 2020-01-02 MED ORDER — CEFAZOLIN SODIUM-DEXTROSE 2-4 GM/100ML-% IV SOLN
2.0000 g | INTRAVENOUS | Status: AC
  Administered 2020-01-02: 2 g via INTRAVENOUS
  Filled 2020-01-02: qty 100

## 2020-01-02 MED ORDER — PHENYLEPHRINE 40 MCG/ML (10ML) SYRINGE FOR IV PUSH (FOR BLOOD PRESSURE SUPPORT)
PREFILLED_SYRINGE | INTRAVENOUS | Status: AC
  Filled 2020-01-02: qty 10

## 2020-01-02 MED ORDER — 0.9 % SODIUM CHLORIDE (POUR BTL) OPTIME
TOPICAL | Status: DC | PRN
  Administered 2020-01-02: 1000 mL

## 2020-01-02 MED ORDER — LIDOCAINE 2% (20 MG/ML) 5 ML SYRINGE
INTRAMUSCULAR | Status: AC
  Filled 2020-01-02: qty 5

## 2020-01-02 MED ORDER — CHLORHEXIDINE GLUCONATE CLOTH 2 % EX PADS: 6.0000 | MEDICATED_PAD | Freq: Once | CUTANEOUS | Status: DC

## 2020-01-02 MED ORDER — ROCURONIUM BROMIDE 10 MG/ML (PF) SYRINGE
PREFILLED_SYRINGE | INTRAVENOUS | Status: AC
  Filled 2020-01-02: qty 10

## 2020-01-02 MED ORDER — HYDROMORPHONE HCL 1 MG/ML IJ SOLN: 1.0000 mg | INTRAMUSCULAR | Status: DC | PRN

## 2020-01-02 MED ORDER — HYDROMORPHONE HCL 1 MG/ML IJ SOLN
0.2500 mg | INTRAMUSCULAR | Status: DC | PRN
  Administered 2020-01-02: 0.5 mg via INTRAVENOUS

## 2020-01-02 MED ORDER — FENTANYL CITRATE (PF) 100 MCG/2ML IJ SOLN
INTRAMUSCULAR | Status: AC
  Administered 2020-01-02: 50 ug via INTRAVENOUS
  Filled 2020-01-02: qty 2

## 2020-01-02 MED ORDER — LABETALOL HCL 5 MG/ML IV SOLN
INTRAVENOUS | Status: AC
  Filled 2020-01-02: qty 4

## 2020-01-02 MED ORDER — HYDROMORPHONE HCL 1 MG/ML IJ SOLN
1.0000 mg | Freq: Once | INTRAMUSCULAR | Status: AC
  Administered 2020-01-02: 1 mg via INTRAVENOUS

## 2020-01-02 MED ORDER — CHLORHEXIDINE GLUCONATE 0.12 % MT SOLN
15.0000 mL | Freq: Once | OROMUCOSAL | Status: AC
  Administered 2020-01-02: 15 mL via OROMUCOSAL

## 2020-01-02 MED ORDER — HYDRALAZINE HCL 20 MG/ML IJ SOLN
10.0000 mg | Freq: Once | INTRAMUSCULAR | Status: AC
  Administered 2020-01-02: 10 mg via INTRAVENOUS

## 2020-01-02 MED ORDER — ORAL CARE MOUTH RINSE: 15.0000 mL | Freq: Once | OROMUCOSAL | Status: AC

## 2020-01-02 MED ORDER — ROCURONIUM BROMIDE 100 MG/10ML IV SOLN
INTRAVENOUS | Status: DC | PRN
  Administered 2020-01-02: 70 mg via INTRAVENOUS

## 2020-01-02 MED ORDER — DEXAMETHASONE SODIUM PHOSPHATE 10 MG/ML IJ SOLN
INTRAMUSCULAR | Status: AC
  Filled 2020-01-02: qty 1

## 2020-01-02 MED ORDER — PHENYLEPHRINE 40 MCG/ML (10ML) SYRINGE FOR IV PUSH (FOR BLOOD PRESSURE SUPPORT)
PREFILLED_SYRINGE | INTRAVENOUS | Status: DC | PRN
  Administered 2020-01-02: 120 ug via INTRAVENOUS
  Administered 2020-01-02: 80 ug via INTRAVENOUS
  Administered 2020-01-02: 40 ug via INTRAVENOUS

## 2020-01-02 MED ORDER — SODIUM CHLORIDE 0.45 % IV SOLN
INTRAVENOUS | Status: DC
  Administered 2020-01-02: 1000 mL via INTRAVENOUS

## 2020-01-02 MED ORDER — FENTANYL CITRATE (PF) 100 MCG/2ML IJ SOLN
INTRAMUSCULAR | Status: DC | PRN
  Administered 2020-01-02 (×2): 100 ug via INTRAVENOUS

## 2020-01-02 MED ORDER — HYDROMORPHONE HCL 1 MG/ML IJ SOLN
INTRAMUSCULAR | Status: AC
  Administered 2020-01-02: 0.5 mg via INTRAVENOUS
  Filled 2020-01-02: qty 1

## 2020-01-02 MED ORDER — ONDANSETRON 4 MG PO TBDP: 4.0000 mg | ORAL_TABLET | Freq: Four times a day (QID) | ORAL | Status: DC | PRN

## 2020-01-02 MED ORDER — LIDOCAINE HCL (CARDIAC) PF 100 MG/5ML IV SOSY
PREFILLED_SYRINGE | INTRAVENOUS | Status: DC | PRN
  Administered 2020-01-02: 100 mg via INTRATRACHEAL

## 2020-01-02 MED ORDER — MIDAZOLAM HCL 2 MG/2ML IJ SOLN
INTRAMUSCULAR | Status: DC | PRN
  Administered 2020-01-02: 2 mg via INTRAVENOUS

## 2020-01-02 MED ORDER — KETOROLAC TROMETHAMINE 15 MG/ML IJ SOLN
15.0000 mg | Freq: Once | INTRAMUSCULAR | Status: AC
  Administered 2020-01-02: 15 mg via INTRAVENOUS

## 2020-01-02 MED ORDER — CALCIUM CARBONATE 1250 (500 CA) MG PO TABS
2.0000 | ORAL_TABLET | Freq: Three times a day (TID) | ORAL | Status: DC
  Administered 2020-01-02 – 2020-01-03 (×2): 1000 mg via ORAL
  Filled 2020-01-02 (×2): qty 1

## 2020-01-02 MED ORDER — FENTANYL CITRATE (PF) 100 MCG/2ML IJ SOLN
INTRAMUSCULAR | Status: AC
  Filled 2020-01-02: qty 2

## 2020-01-02 MED ORDER — ONDANSETRON HCL 4 MG/2ML IJ SOLN
INTRAMUSCULAR | Status: AC
  Filled 2020-01-02: qty 2

## 2020-01-02 MED ORDER — DEXAMETHASONE SODIUM PHOSPHATE 10 MG/ML IJ SOLN
INTRAMUSCULAR | Status: DC | PRN
  Administered 2020-01-02: 10 mg via INTRAVENOUS

## 2020-01-02 MED ORDER — FENTANYL CITRATE (PF) 100 MCG/2ML IJ SOLN
25.0000 ug | INTRAMUSCULAR | Status: DC | PRN
  Administered 2020-01-02: 50 ug via INTRAVENOUS

## 2020-01-02 MED ORDER — PROPOFOL 10 MG/ML IV BOLUS
INTRAVENOUS | Status: AC
  Filled 2020-01-02: qty 20

## 2020-01-02 MED ORDER — OXYCODONE HCL 5 MG PO TABS
5.0000 mg | ORAL_TABLET | ORAL | Status: DC | PRN
  Administered 2020-01-02: 5 mg via ORAL
  Administered 2020-01-03 (×2): 10 mg via ORAL
  Filled 2020-01-02: qty 2
  Filled 2020-01-02: qty 1
  Filled 2020-01-02: qty 2

## 2020-01-02 MED ORDER — ACETAMINOPHEN 500 MG PO TABS
1000.0000 mg | ORAL_TABLET | Freq: Once | ORAL | Status: AC
  Administered 2020-01-02: 1000 mg via ORAL
  Filled 2020-01-02: qty 2

## 2020-01-02 MED ORDER — PROMETHAZINE HCL 25 MG/ML IJ SOLN: 6.2500 mg | INTRAMUSCULAR | Status: DC | PRN

## 2020-01-02 MED ORDER — LACTATED RINGERS IV SOLN: INTRAVENOUS | Status: DC

## 2020-01-02 MED ORDER — MIDAZOLAM HCL 2 MG/2ML IJ SOLN
INTRAMUSCULAR | Status: AC
  Filled 2020-01-02: qty 2

## 2020-01-02 SURGICAL SUPPLY — 29 items
ATTRACTOMAT 16X20 MAGNETIC DRP (DRAPES) ×3 IMPLANT
BLADE SURG 15 STRL LF DISP TIS (BLADE) ×1 IMPLANT
BLADE SURG 15 STRL SS (BLADE) ×2
CHLORAPREP W/TINT 26 (MISCELLANEOUS) ×3 IMPLANT
CLIP VESOCCLUDE MED 6/CT (CLIP) ×9 IMPLANT
CLIP VESOCCLUDE SM WIDE 6/CT (CLIP) ×15 IMPLANT
COVER SURGICAL LIGHT HANDLE (MISCELLANEOUS) ×3 IMPLANT
COVER WAND RF STERILE (DRAPES) ×3 IMPLANT
DERMABOND ADVANCED (GAUZE/BANDAGES/DRESSINGS) ×2
DERMABOND ADVANCED .7 DNX12 (GAUZE/BANDAGES/DRESSINGS) ×1 IMPLANT
DRAPE LAPAROTOMY T 98X78 PEDS (DRAPES) ×3 IMPLANT
ELECT PENCIL ROCKER SW 15FT (MISCELLANEOUS) ×3 IMPLANT
ELECT REM PT RETURN 15FT ADLT (MISCELLANEOUS) ×3 IMPLANT
GAUZE 4X4 16PLY RFD (DISPOSABLE) ×3 IMPLANT
GLOVE SURG ORTHO 8.0 STRL STRW (GLOVE) ×3 IMPLANT
GOWN STRL REUS W/TWL XL LVL3 (GOWN DISPOSABLE) ×6 IMPLANT
HEMOSTAT SURGICEL 2X4 FIBR (HEMOSTASIS) ×3 IMPLANT
ILLUMINATOR WAVEGUIDE N/F (MISCELLANEOUS) ×3 IMPLANT
KIT BASIN OR (CUSTOM PROCEDURE TRAY) ×3 IMPLANT
KIT TURNOVER KIT A (KITS) IMPLANT
PACK BASIC VI WITH GOWN DISP (CUSTOM PROCEDURE TRAY) ×3 IMPLANT
SHEARS HARMONIC 9CM CVD (BLADE) ×3 IMPLANT
SUT MNCRL AB 4-0 PS2 18 (SUTURE) ×3 IMPLANT
SUT VIC AB 3-0 SH 18 (SUTURE) ×6 IMPLANT
SYR BULB IRRIG 60ML STRL (SYRINGE) ×3 IMPLANT
TOWEL OR 17X26 10 PK STRL BLUE (TOWEL DISPOSABLE) ×3 IMPLANT
TOWEL OR NON WOVEN STRL DISP B (DISPOSABLE) ×3 IMPLANT
TUBING CONNECTING 10 (TUBING) ×2 IMPLANT
TUBING CONNECTING 10' (TUBING) ×1

## 2020-01-02 NOTE — Interval H&P Note (Signed)
History and Physical Interval Note:  01/02/2020 7:23 AM  Ricardo House  has presented today for surgery, with the diagnosis of PAPILLARY THYROID CARCINOMA.  The various methods of treatment have been discussed with the patient and family. After consideration of risks, benefits and other options for treatment, the patient has consented to    Procedure(s): TOTAL THYROIDECTOMY WITH LIMITED LYMPH NODE DISSECTION (N/A) as a surgical intervention.    The patient's history has been reviewed, patient examined, no change in status, stable for surgery.  I have reviewed the patient's chart and labs.  Questions were answered to the patient's satisfaction.    Armandina Gemma, MD [facility] Office: [place]

## 2020-01-02 NOTE — Transfer of Care (Signed)
Immediate Anesthesia Transfer of Care Note  [name]  Procedure(s) Performed: TOTAL THYROIDECTOMY WITH LIMITED LYMPH NODE DISSECTION (N/A Neck)  Patient Location: PACU  Anesthesia Type:General  Level of Consciousness: awake and drowsy  Airway & Oxygen Therapy: Patient Spontanous Breathing and Patient connected to face mask oxygen  Post-op Assessment: Report given to RN and Post -op Vital signs reviewed and stable  Post vital signs: Reviewed and stable  Last Vitals:  Vitals Value Taken Time  BP 169/95 01/02/20 1035  Temp    Pulse 75 01/02/20 1036  Resp 20 01/02/20 1036  SpO2 100 % 01/02/20 1036  Vitals shown include unvalidated device data.  Last Pain:  Vitals:   01/02/20 0715  TempSrc:   PainSc: 0-No pain         Complications: No complications documented.

## 2020-01-02 NOTE — Anesthesia Procedure Notes (Signed)
Procedure Name: Intubation Date/Time: 01/02/2020 8:15 AM Performed by: [place] Garnett Rekowski C, CRNA Pre-anesthesia Checklist: Patient identified, Emergency Drugs available, Suction available and Patient being monitored Patient Re-evaluated:Patient Re-evaluated prior to induction Oxygen Delivery Method: Circle system utilized Preoxygenation: Pre-oxygenation with 100% oxygen Induction Type: IV induction Ventilation: Mask ventilation without difficulty Laryngoscope Size: Mac and 4 Grade View: Grade I Tube type: Oral Tube size: 7.5 mm Number of attempts: 1 Airway Equipment and Method: Stylet and Oral airway Placement Confirmation: ETT inserted through vocal cords under direct vision,  positive ETCO2 and breath sounds checked- equal and bilateral Secured at: 23 cm Tube secured with: Tape Dental Injury: Teeth and Oropharynx as per pre-operative assessment

## 2020-01-02 NOTE — Op Note (Signed)
Procedure Note  Pre-operative Diagnosis:  Papillary thyroid carcinoma  Post-operative Diagnosis:  same  Surgeon:  Armandina Gemma, MD  Assistant:  Carlena Hurl, PA-C   Procedure:  Total thyroidectomy with central compartment lymph node dissection  Anesthesia:  General  Estimated Blood Loss:  minimal  Drains: none         Specimen: thyroid to pathology  Indications:  Patient is referred by Dr. Donnie Coffin for surgical evaluation and management of newly diagnosed papillary thyroid carcinoma. Patient had presented to his primary care physician for evaluation of numbness in the right hand. On physical examination he was noted to have a right neck mass. This was a new finding. Patient was sent for ultrasound examination performed on October 27, 2019. This demonstrated an enlarged thyroid gland with a dominant mass in the right thyroid lobe measuring 5.5 x 3.2 x 2.9 cm. Fine-needle aspiration biopsy was recommended and was performed on November 06, 2019. This showed findings consistent with papillary thyroid carcinoma, Bethesda category VI. Patient now comes to surgery for resection.  Procedure Details: Procedure was done in OR #4 at the [facility]. The patient was brought to the operating room and placed in a supine position on the operating room table. Following administration of general anesthesia, the patient was positioned and then prepped and draped in the usual aseptic fashion. After ascertaining that an adequate level of anesthesia had been achieved, a small Kocher incision was made with #15 blade. Dissection was carried through subcutaneous tissues and platysma.Hemostasis was achieved with the electrocautery. Skin flaps were elevated cephalad and caudad from the thyroid notch to the sternal notch. A Mahorner self-retaining retractor was placed for exposure. Strap muscles were incised in the midline and dissection was begun on the left side.  Strap muscles were reflected laterally.   Left thyroid lobe was normal in size without obvious nodules.  The left lobe was gently mobilized with blunt dissection. Superior pole vessels were dissected out and divided individually between small and medium ligaclips with the harmonic scalpel. The thyroid lobe was rolled anteriorly. Branches of the inferior thyroid artery were divided between small ligaclips with the harmonic scalpel. Inferior venous tributaries were divided between ligaclips. Both the superior and inferior parathyroid glands were identified and preserved on their vascular pedicles. The recurrent laryngeal nerve was identified and preserved along its course. The ligament of Gwenlyn Found was released with the electrocautery and the gland was mobilized onto the anterior trachea. Isthmus was mobilized across the midline. There was no significant pyramidal lobe present. Dry pack was placed in the left neck.  The right thyroid lobe was gently mobilized with blunt dissection. Right thyroid lobe was markedly enlarged with a dominant central mass. Superior pole vessels were dissected out and divided between small and medium ligaclips with the Harmonic scalpel. Superior parathyroid was identified and preserved. Inferior venous tributaries were divided between medium ligaclips with the harmonic scalpel. The right thyroid lobe was rolled anteriorly and the branches of the inferior thyroid artery divided between small ligaclips. The right recurrent laryngeal nerve was identified and preserved along its course. The ligament of Gwenlyn Found was released with the electrocautery. The right thyroid lobe was mobilized onto the anterior trachea and the remainder of the thyroid was dissected off the anterior trachea and the thyroid was completely excised. A suture was used to mark the right lobe. The entire thyroid gland was submitted to pathology for review.  Central compartment tissue was excised using the electrocautery for hemostasis.  Tissue was  removed from the space  anterior to the trachea between the carotid arteries.  Care was taken to avoid the parathyroid tissue and the recurrent nerves.  No obvious pathologic lymph nodes were identified.  The neck was irrigated with warm saline. Fibrillar was placed throughout the operative field. Strap muscles were approximated in the midline with interrupted 3-0 Vicryl sutures. Platysma was closed with interrupted 3-0 Vicryl sutures. Skin was closed with a running 4-0 Monocryl subcuticular suture. Wound was washed and Dermabond was applied. The patient was awakened from anesthesia and brought to the recovery room. The patient tolerated the procedure well.   Armandina Gemma, MD [facility] Office: [phone]

## 2020-01-02 NOTE — Anesthesia Postprocedure Evaluation (Signed)
Anesthesia Post Note  [name]  Procedure(s) Performed: TOTAL THYROIDECTOMY WITH LIMITED LYMPH NODE DISSECTION (N/A Neck)     Patient location during evaluation: PACU Anesthesia Type: General Level of consciousness: awake and alert and awake Pain management: pain level controlled Vital Signs Assessment: post-procedure vital signs reviewed and stable Respiratory status: spontaneous breathing, nonlabored ventilation, respiratory function stable and patient connected to nasal cannula oxygen Cardiovascular status: blood pressure returned to baseline and stable Postop Assessment: no apparent nausea or vomiting Anesthetic complications: no   No complications documented.  Last Vitals:  Vitals:   01/02/20 1133 01/02/20 1145  BP: (!) 190/97 (!) 190/92  Pulse:  89  Resp:  15  Temp:    SpO2:  99%    Last Pain:  Vitals:   01/02/20 1145  TempSrc:   PainSc: 6                  Catalina Gravel

## 2020-01-03 ENCOUNTER — Other Ambulatory Visit: Payer: Self-pay

## 2020-01-03 ENCOUNTER — Encounter (HOSPITAL_COMMUNITY): Payer: Self-pay | Admitting: Surgery

## 2020-01-03 DIAGNOSIS — Z6830 Body mass index (BMI) 30.0-30.9, adult: Secondary | ICD-10-CM | POA: Diagnosis not present

## 2020-01-03 DIAGNOSIS — E669 Obesity, unspecified: Secondary | ICD-10-CM | POA: Diagnosis not present

## 2020-01-03 DIAGNOSIS — C73 Malignant neoplasm of thyroid gland: Secondary | ICD-10-CM | POA: Diagnosis not present

## 2020-01-03 LAB — BASIC METABOLIC PANEL WITH GFR
Anion gap: 8 (ref 5–15)
BUN: 17 mg/dL (ref 6–20)
CO2: 25 mmol/L (ref 22–32)
Calcium: 8.9 mg/dL (ref 8.9–10.3)
Chloride: 104 mmol/L (ref 98–111)
Creatinine, Ser: 0.87 mg/dL (ref 0.61–1.24)
GFR calc Af Amer: >60 mL/min
GFR calc non Af Amer: >60 mL/min
Glucose, Bld: 135 mg/dL — ABNORMAL HIGH (ref 70–99)
Potassium: 4.2 mmol/L (ref 3.5–5.1)
Sodium: 137 mmol/L (ref 135–145)

## 2020-01-03 LAB — SURGICAL PATHOLOGY

## 2020-01-03 MED ORDER — LEVOTHYROXINE SODIUM 125 MCG PO TABS: 125.0000 ug | ORAL_TABLET | Freq: Every day | ORAL | 3 refills | Status: DC

## 2020-01-03 MED ORDER — CALCIUM CARBONATE ANTACID 500 MG PO CHEW: 2.0000 | CHEWABLE_TABLET | Freq: Two times a day (BID) | ORAL | 1 refills | Status: DC

## 2020-01-03 MED ORDER — MENTHOL 3 MG MT LOZG
1.0000 | LOZENGE | OROMUCOSAL | Status: DC | PRN
  Administered 2020-01-03: 3 mg via ORAL
  Filled 2020-01-03: qty 9

## 2020-01-03 MED ORDER — OXYCODONE HCL 5 MG PO TABS: 5.0000 mg | ORAL_TABLET | ORAL | 0 refills | Status: DC | PRN

## 2020-01-03 NOTE — Plan of Care (Signed)
Plan of care reviewed and discussed with the patient. 

## 2020-01-03 NOTE — Discharge Summary (Signed)
Physician Discharge Summary [facility]  Patient ID: Ricardo House MRN: [id number] DOB/AGE: [birth year] 49 y.o.  Admit date: 01/02/2020 Discharge date: 01/03/2020  Admission Diagnoses:  Papillary thyroid carcinoma  Discharge Diagnoses:  Principal Problem:   Papillary thyroid carcinoma [facility])   Discharged Condition: good  Hospital Course: Patient was admitted for observation following thyroid surgery.  Post op course was uncomplicated.  Pain was well controlled.  Tolerated diet.  Post op calcium level on morning following surgery was 8.9 mg/dl.  Patient was prepared for discharge home on POD#1.  Consults: None  Treatments: surgery: total thyroidectomy with limited lymph node dissection  Discharge Exam: Blood pressure (!) 148/83, pulse 71, temperature 97.8 F (36.6 C), temperature source Oral, resp. rate 18, height 6\' 5"  (1.956 m), weight 117.6 kg, SpO2 98 %. HEENT - clear Neck - wound dry and intact; mild STS; voice normal; Dermabond in place   Disposition: Home  Discharge Instructions    Diet - low sodium heart healthy   Complete by: As directed    Discharge instructions   Complete by: As directed    [facility]  THYROID & PARATHYROID SURGERY:  POST-OP INSTRUCTIONS  Always review your discharge instruction sheet from the facility where your surgery was performed.  A prescription for pain medication may be given to you upon discharge.  Take your pain medication as prescribed.  If narcotic pain medicine is not needed, then you may take acetaminophen (Tylenol) or ibuprofen (Advil) as needed.  Take your usually prescribed medications unless otherwise directed.  If you need a refill on your pain medication, please contact our office during regular business hours.  Prescriptions cannot be processed by our office after 5 pm or on weekends.  Start with a light diet upon arrival home, such as soup and crackers or toast.  Be sure to drink  plenty of fluids daily.  Resume your normal diet the day after surgery.  Most patients will experience some swelling and bruising on the chest and neck area.  Ice packs will help.  Swelling and bruising can take several days to resolve.   It is common to experience some constipation after surgery.  Increasing fluid intake and taking a stool softener (Colace) will usually help or prevent this problem.  A mild laxative (Milk of Magnesia or Miralax) should be taken according to package directions if there has been no bowel movement after 48 hours.  You have steri-strips and a gauze dressing over your incision.  You may remove the gauze bandage on the second day after surgery, and you may shower at that time.  Leave your steri-strips (small skin tapes) in place directly over the incision.  These strips should remain on the skin for 5-7 days and then be removed.  You may get them wet in the shower and pat them dry.  You may resume regular (light) daily activities beginning the next day (such as daily self-care, walking, climbing stairs) gradually increasing activities as tolerated.  You may have sexual intercourse when it is comfortable.  Refrain from any heavy lifting or straining until approved by your doctor.  You may drive when you no longer are taking prescription pain medication, you can comfortably wear a seatbelt, and you can safely maneuver your car and apply brakes.  You should see your doctor in the office for a follow-up appointment approximately three weeks after your surgery.  Make sure that you call for this appointment within a day or  two after you arrive home to insure a convenient appointment time.  WHEN TO CALL YOUR DOCTOR: -- Fever greater than 101.5 -- Inability to urinate -- Nausea and/or vomiting - persistent -- Extreme swelling or bruising -- Continued bleeding from incision -- Increased pain, redness, or drainage from the incision -- Difficulty swallowing or breathing --  Muscle cramping or spasms -- Numbness or tingling in hands or around lips  The clinic staff is available to answer your questions during regular business hours.  Please don't hesitate to call and ask to speak to one of the nurses if you have concerns.  Armandina Gemma, MD [facility] Office: [phone]   Increase activity slowly   Complete by: As directed    No dressing needed   Complete by: As directed      Allergies as of 01/03/2020   No Known Allergies     Medication List    TAKE these medications   calcium carbonate 500 MG chewable tablet Commonly known as: Tums Chew 2 tablets (400 mg of elemental calcium total) by mouth 2 (two) times daily.   Glucosamine HCl 1500 MG Tabs Take 3,000 mg by mouth daily.   ibuprofen 200 MG tablet Commonly known as: ADVIL Take 400 mg by mouth at bedtime as needed for moderate pain.   levocetirizine 5 MG tablet Commonly known as: XYZAL Take 5 mg by mouth daily.   levothyroxine 125 MCG tablet Commonly known as: Synthroid Take 1 tablet (125 mcg total) by mouth daily before breakfast.   oxyCODONE 5 MG immediate release tablet Commonly known as: Oxy IR/ROXICODONE Take 1-2 tablets (5-10 mg total) by mouth every 4 (four) hours as needed for moderate pain.            Discharge Care Instructions  (From admission, onward)         Start     Ordered   01/03/20 0000  No dressing needed        01/03/20 3202          Follow-up Information    Armandina Gemma, MD. Schedule an appointment as soon as possible for a visit in 3 week(s).   Specialty: General Surgery Contact information: [address] [place] [address] [phone]        Delrae Rend, MD. Schedule an appointment as soon as possible for a visit in 3 week(s).   Specialty: Endocrinology Contact information: [address] [place] [address] [phone]               Allycia Pitz M. Sapphire Tygart, MD, [facility] Office: [phone]   Signed: Armandina Gemma 01/03/2020, 7:49 AM

## 2020-01-07 ENCOUNTER — Other Ambulatory Visit (HOSPITAL_COMMUNITY): Payer: Managed Care, Other (non HMO)

## 2020-01-10 ENCOUNTER — Other Ambulatory Visit (HOSPITAL_COMMUNITY): Payer: Managed Care, Other (non HMO)

## 2020-01-29 DIAGNOSIS — E890 Postprocedural hypothyroidism: Secondary | ICD-10-CM | POA: Diagnosis not present

## 2020-01-29 DIAGNOSIS — Z808 Family history of malignant neoplasm of other organs or systems: Secondary | ICD-10-CM | POA: Diagnosis not present

## 2020-01-29 DIAGNOSIS — C73 Malignant neoplasm of thyroid gland: Secondary | ICD-10-CM | POA: Diagnosis not present

## 2020-01-29 DIAGNOSIS — Z9889 Other specified postprocedural states: Secondary | ICD-10-CM | POA: Diagnosis not present

## 2020-02-01 DIAGNOSIS — C73 Malignant neoplasm of thyroid gland: Secondary | ICD-10-CM | POA: Diagnosis not present

## 2020-02-01 DIAGNOSIS — Z9889 Other specified postprocedural states: Secondary | ICD-10-CM | POA: Diagnosis not present

## 2020-02-07 ENCOUNTER — Ambulatory Visit: Payer: Self-pay

## 2020-02-07 ENCOUNTER — Ambulatory Visit (INDEPENDENT_AMBULATORY_CARE_PROVIDER_SITE_OTHER): Payer: BC Managed Care – PPO | Admitting: Orthopedic Surgery

## 2020-02-07 DIAGNOSIS — M25561 Pain in right knee: Secondary | ICD-10-CM

## 2020-02-10 ENCOUNTER — Encounter: Payer: Self-pay | Admitting: Orthopedic Surgery

## 2020-02-10 NOTE — Progress Notes (Signed)
Office Visit Note   [name]           Date of Birth: 1971/03/17           MRN: [id number] Visit Date: 02/07/2020 Requested by: Alroy Dust, L.Marlou Sa, [place] [address] [place],  [place] [address] PCP: Alroy Dust, L.Marlou Sa, MD  Subjective: Chief Complaint  Patient presents with  . Right Knee - Pain    HPI: Ricardo House is a 49 year old patient with severe bilateral knee arthritis in the medial compartment.  Also has a history of lateral release as well as moderate patellofemoral arthritis.  Last aspiration and injection only gave him about 2 weeks of relief.  Prior to that they have been working for many months.  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  Reports night pain and rest pain as well as pain which interferes with his activities of daily living.  Does take anti-inflammatories to help with symptoms.              ROS: All systems reviewed are negative as they relate to the chief complaint within the history of present illness.  Patient denies  fevers or chills.   Assessment & Plan: Visit Diagnoses:  1. Right knee pain, unspecified chronicity     Plan: Impression is right knee arthritis which is symptomatic.  History of prior arthroscopy and lateral release on that side.  Radiographs do show bilateral varus alignment and medial compartment space narrowing.  Plan is aspiration today.  Would like to preapproved him for Synvisc 1 gel injection.  He may need to consider intervention in the knee toAllow for better function.  I think in his case total knee replacement would be more practical and predictable than osteotomy.   Follow-Up Instructions: Return if symptoms worsen or fail to improve.   Orders:  Orders Placed This Encounter  Procedures  . XR Knee 1-2 Views Right   No orders of the defined types were placed in this encounter.     Procedures: No procedures performed   Clinical  Data: No additional findings.  Objective: Vital Signs: There were no vitals taken for this visit.  Physical Exam:    Constitutional: Patient appears well-developed HEENT:  Head: Normocephalic Eyes:EOM are normal Neck: Normal range of motion Cardiovascular: Normal rate Pulmonary/chest: Effort normal Neurologic: Patient is alert Skin: Skin is warm Psychiatric: Patient has normal mood and affect    Ortho Exam: Ortho exam demonstrates varus alignment bilateral lower extremities with palpable pedal pulses.  He does have about a 5 to 8 degree flexion contracture right leg no flexion contracture left leg.  Moderate effusion is present.  Extensor mechanism is intact.  Palpable evidence of prior lateral releases noted.  Patella tracks reasonably well but patellofemoral crepitus is present.  No groin pain with internal or external Tatian of the leg.  No other masses lymphadenopathy or skin changes noted in that right leg region.  Specialty Comments:  No specialty comments available.  Imaging: No results found.   PMFS History: Patient Active Problem List   Diagnosis Date Noted  . 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 Thyroid cancer ([facility])     No family history on file.  Past Surgical History:  Procedure Laterality Date  . KNEE SURGERY Bilateral   . THYROIDECTOMY N/A 01/02/2020   Procedure: TOTAL THYROIDECTOMY WITH LIMITED LYMPH NODE DISSECTION;  Surgeon:  Armandina Gemma, MD;  Location: [facility];  Service: General;  Laterality: N/A;  . WISDOM TOOTH EXTRACTION     Social History   Occupational History  . Not on file  Tobacco Use  . Smoking status: Never Smoker  . Smokeless tobacco: Never Used  Vaping Use  . Vaping Use: Never used  Substance and Sexual Activity  . Alcohol use: Yes    Alcohol/week: 1.0 standard drink    Types: 1 Cans of beer per week    Comment: 3 days a week  . Drug use: Not on file  . Sexual activity: Not on  file

## 2020-02-15 ENCOUNTER — Telehealth: Payer: Self-pay

## 2020-02-15 NOTE — Telephone Encounter (Signed)
-----   Message from Laurann Montana, RT sent at 02/11/2020  9:45 AM EDT -----  ----- Message ----- From: Meredith Pel, MD Sent: 02/10/2020   9:41 AM EDT To: Laurann Montana, RT  Please preapproved him for Synvisc 1 thank you

## 2020-02-15 NOTE — Telephone Encounter (Signed)
Noted  

## 2020-02-18 ENCOUNTER — Telehealth: Payer: Self-pay

## 2020-02-18 NOTE — Telephone Encounter (Signed)
Submitted VOB, SynviscOne, right knee. 

## 2020-02-25 DIAGNOSIS — Z808 Family history of malignant neoplasm of other organs or systems: Secondary | ICD-10-CM | POA: Diagnosis not present

## 2020-02-25 DIAGNOSIS — C73 Malignant neoplasm of thyroid gland: Secondary | ICD-10-CM | POA: Diagnosis not present

## 2020-02-25 DIAGNOSIS — E890 Postprocedural hypothyroidism: Secondary | ICD-10-CM | POA: Diagnosis not present

## 2020-02-25 DIAGNOSIS — Z9889 Other specified postprocedural states: Secondary | ICD-10-CM | POA: Diagnosis not present

## 2020-03-03 ENCOUNTER — Telehealth: Payer: Self-pay

## 2020-03-03 NOTE — Telephone Encounter (Signed)
PA required for SynviscOne, right knee. Faxed completed PA form to [employer] at [phone]. 

## 2020-03-12 DIAGNOSIS — E890 Postprocedural hypothyroidism: Secondary | ICD-10-CM | POA: Diagnosis not present

## 2020-03-12 DIAGNOSIS — C73 Malignant neoplasm of thyroid gland: Secondary | ICD-10-CM | POA: Diagnosis not present

## 2020-03-28 ENCOUNTER — Telehealth: Payer: Self-pay

## 2020-03-28 NOTE — Telephone Encounter (Signed)
Called and scheduled

## 2020-03-28 NOTE — Telephone Encounter (Signed)
Approved for Synvisc One-Right knee Dr. Latanya Presser and [facility] No copay Covered @ 100% after deductible  Prior auth required Auth # [id number] Dates: 03/03/20-08/31/19  Ok to schedule @ next available

## 2020-04-09 ENCOUNTER — Ambulatory Visit (INDEPENDENT_AMBULATORY_CARE_PROVIDER_SITE_OTHER): Payer: BC Managed Care – PPO | Admitting: Orthopedic Surgery

## 2020-04-09 DIAGNOSIS — M1711 Unilateral primary osteoarthritis, right knee: Secondary | ICD-10-CM

## 2020-04-12 ENCOUNTER — Encounter: Payer: Self-pay | Admitting: Orthopedic Surgery

## 2020-04-12 DIAGNOSIS — M1711 Unilateral primary osteoarthritis, right knee: Secondary | ICD-10-CM

## 2020-04-12 NOTE — Progress Notes (Signed)
   Procedure Note  [name]             Date of Birth: 11-Jul-1970           MRN: [id number]             Visit Date: 04/09/2020  Procedures: Visit Diagnoses:  1. 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  Positive effusion today.  Successful aspiration of 30 cc.  Follow-up as needed. Procedure, treatment alternatives, risks and benefits explained, specific risks discussed. Consent was given by the patient. Immediately prior to procedure a time out was called to verify the correct patient, procedure, equipment, support staff and site/side marked as required. Patient was prepped and draped in the usual sterile fashion.

## 2020-04-14 MED ORDER — HYLAN G-F 20 48 MG/6ML IX SOSY
48.0000 mg | PREFILLED_SYRINGE | INTRA_ARTICULAR | Status: AC | PRN
  Administered 2020-04-12: 48 mg via INTRA_ARTICULAR

## 2020-04-14 MED ORDER — LIDOCAINE HCL 1 % IJ SOLN
5.0000 mL | INTRAMUSCULAR | Status: AC | PRN
  Administered 2020-04-12: 19:00:00 5 mL

## 2020-05-01 DIAGNOSIS — M791 Myalgia, unspecified site: Secondary | ICD-10-CM | POA: Diagnosis not present

## 2020-05-01 DIAGNOSIS — M9902 Segmental and somatic dysfunction of thoracic region: Secondary | ICD-10-CM | POA: Diagnosis not present

## 2020-05-01 DIAGNOSIS — M9905 Segmental and somatic dysfunction of pelvic region: Secondary | ICD-10-CM | POA: Diagnosis not present

## 2020-05-01 DIAGNOSIS — M9903 Segmental and somatic dysfunction of lumbar region: Secondary | ICD-10-CM | POA: Diagnosis not present

## 2020-05-02 DIAGNOSIS — Z808 Family history of malignant neoplasm of other organs or systems: Secondary | ICD-10-CM | POA: Diagnosis not present

## 2020-05-02 DIAGNOSIS — Z9889 Other specified postprocedural states: Secondary | ICD-10-CM | POA: Diagnosis not present

## 2020-05-02 DIAGNOSIS — Z23 Encounter for immunization: Secondary | ICD-10-CM | POA: Diagnosis not present

## 2020-05-02 DIAGNOSIS — C73 Malignant neoplasm of thyroid gland: Secondary | ICD-10-CM | POA: Diagnosis not present

## 2020-05-02 DIAGNOSIS — E890 Postprocedural hypothyroidism: Secondary | ICD-10-CM | POA: Diagnosis not present

## 2020-05-08 DIAGNOSIS — Z1159 Encounter for screening for other viral diseases: Secondary | ICD-10-CM | POA: Diagnosis not present

## 2020-05-14 DIAGNOSIS — M9903 Segmental and somatic dysfunction of lumbar region: Secondary | ICD-10-CM | POA: Diagnosis not present

## 2020-05-14 DIAGNOSIS — M9902 Segmental and somatic dysfunction of thoracic region: Secondary | ICD-10-CM | POA: Diagnosis not present

## 2020-05-14 DIAGNOSIS — M9905 Segmental and somatic dysfunction of pelvic region: Secondary | ICD-10-CM | POA: Diagnosis not present

## 2020-05-14 DIAGNOSIS — M791 Myalgia, unspecified site: Secondary | ICD-10-CM | POA: Diagnosis not present

## 2020-05-14 DIAGNOSIS — E890 Postprocedural hypothyroidism: Secondary | ICD-10-CM | POA: Diagnosis not present

## 2020-05-22 DIAGNOSIS — M9903 Segmental and somatic dysfunction of lumbar region: Secondary | ICD-10-CM | POA: Diagnosis not present

## 2020-05-22 DIAGNOSIS — M9902 Segmental and somatic dysfunction of thoracic region: Secondary | ICD-10-CM | POA: Diagnosis not present

## 2020-05-22 DIAGNOSIS — M9905 Segmental and somatic dysfunction of pelvic region: Secondary | ICD-10-CM | POA: Diagnosis not present

## 2020-05-22 DIAGNOSIS — M791 Myalgia, unspecified site: Secondary | ICD-10-CM | POA: Diagnosis not present

## 2020-05-23 ENCOUNTER — Ambulatory Visit (INDEPENDENT_AMBULATORY_CARE_PROVIDER_SITE_OTHER): Payer: BC Managed Care – PPO | Admitting: Orthopedic Surgery

## 2020-05-23 ENCOUNTER — Encounter: Payer: Self-pay | Admitting: Orthopedic Surgery

## 2020-05-23 ENCOUNTER — Other Ambulatory Visit: Payer: Self-pay

## 2020-05-23 DIAGNOSIS — M1711 Unilateral primary osteoarthritis, right knee: Secondary | ICD-10-CM | POA: Diagnosis not present

## 2020-05-23 MED ORDER — LIDOCAINE HCL 1 % IJ SOLN
5.0000 mL | INTRAMUSCULAR | Status: AC | PRN
  Administered 2020-05-23: 5 mL

## 2020-05-23 NOTE — Progress Notes (Signed)
Office Visit Note   [name]           Date of Birth: [birth year]           MRN: [id number] Visit Date: 05/23/2020 Requested by: Alroy Dust, L.Marlou Sa, [place] [address] [place],  [place] [address] PCP: Alroy Dust, L.Marlou Sa, MD  Subjective: Chief Complaint  Patient presents with  . Right Knee - Pain    HPI: Ricardo House is a 50 y.o. male who presents to the office complaining of right knee pain.  Patient is s/p gel injection on 04/12/2020.  He states this is provided about 80% relief.  He notes occasional aching at night but no longer wakes with pain.  He does note continued stiffness of the right knee but this does not bother him very much.  He presents today for aspiration as he is planning on going on a ski trip this weekend and would like to make sure that he has as much mobility as he can have in the right knee for his trip.  Does note occasional left buttocks pain with occasional radiation down the posterior leg into the calf but this radicular pain is very infrequent.  He has started doing hamstring stretches to help with this.  This is been going on for about 1 month but does not seem a major concern of his.                ROS: All systems reviewed are negative as they relate to the chief complaint within the history of present illness.  Patient denies fevers or chills.  Assessment & Plan: Visit Diagnoses:  1. Unilateral primary osteoarthritis, right knee     Plan: Patient is a 50 year old male who presents complaint of right knee pain.  He has history of right knee osteoarthritis.  He has had 80% relief from right knee gel injection on 04/12/2020.  We can repeat this 6 months from that day.  Okay for cortisone injection about 6 to 8 weeks from now if he would like 1.  Right knee was aspirated of about 30 cc of fluid.  He tolerated the procedure well.  Plan to follow-up as needed.  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  We will try to delay knee replacement as long as possible.  Follow-Up Instructions: No follow-ups on file.   Orders:  No orders of the defined types were placed in this encounter.  No orders of the defined types were placed in this encounter.     Procedures: Large Joint Inj: R knee on 05/23/2020 9:26 AM Indications: diagnostic evaluation, joint swelling and pain Details: 18 G 1.5 in needle, superolateral approach  Arthrogram: No  Medications: 5 mL lidocaine 1 % Aspirate: 30 mL Outcome: tolerated well, no immediate complications Procedure, treatment alternatives, risks and benefits explained, specific risks discussed. Consent was given by the patient. Immediately prior to procedure a time out was called to verify the correct patient, procedure, equipment, support staff and site/side marked as required. Patient was prepped and draped in the usual sterile fashion.       Clinical Data: No additional findings.  Objective: Vital Signs: There were no vitals taken for this visit.  Physical Exam:  Constitutional: Patient appears well-developed HEENT:  Head: Normocephalic Eyes:EOM are normal Neck: Normal range of motion Cardiovascular: Normal rate Pulmonary/chest: Effort normal Neurologic: Patient is alert Skin: Skin is warm Psychiatric: Patient has normal mood and affect  Ortho Exam: Ortho exam demonstrates right knee with 20 degree flexion contracture.  Large effusion.  No calf tenderness.  Negative Homans' sign.  Tenderness over the medial joint line.  No tenderness over the lateral joint line.  No pain with hip range of motion.  Specialty Comments:  No specialty comments available.  Imaging: No results found.   PMFS History: Patient Active Problem List   Diagnosis Date Noted  . 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 Thyroid cancer ([facility])     No family history on file.  Past Surgical History:  Procedure  Laterality Date  . KNEE SURGERY Bilateral   . THYROIDECTOMY N/A 01/02/2020   Procedure: TOTAL THYROIDECTOMY WITH LIMITED LYMPH NODE DISSECTION;  Surgeon: Armandina Gemma, MD;  Location: [facility];  Service: General;  Laterality: N/A;  . WISDOM TOOTH EXTRACTION     Social History   Occupational History  . Not on file  Tobacco Use  . Smoking status: Never Smoker  . Smokeless tobacco: Never Used  Vaping Use  . Vaping Use: Never used  Substance and Sexual Activity  . Alcohol use: Yes    Alcohol/week: 1.0 standard drink    Types: 1 Cans of beer per week    Comment: 3 days a week  . Drug use: Not on file  . Sexual activity: Not on file

## 2020-06-03 DIAGNOSIS — M9902 Segmental and somatic dysfunction of thoracic region: Secondary | ICD-10-CM | POA: Diagnosis not present

## 2020-06-03 DIAGNOSIS — M9903 Segmental and somatic dysfunction of lumbar region: Secondary | ICD-10-CM | POA: Diagnosis not present

## 2020-06-03 DIAGNOSIS — M791 Myalgia, unspecified site: Secondary | ICD-10-CM | POA: Diagnosis not present

## 2020-06-03 DIAGNOSIS — M9905 Segmental and somatic dysfunction of pelvic region: Secondary | ICD-10-CM | POA: Diagnosis not present

## 2020-06-25 DIAGNOSIS — L02231 Carbuncle of abdominal wall: Secondary | ICD-10-CM | POA: Diagnosis not present

## 2020-06-25 DIAGNOSIS — M542 Cervicalgia: Secondary | ICD-10-CM | POA: Diagnosis not present

## 2020-06-25 DIAGNOSIS — Z1159 Encounter for screening for other viral diseases: Secondary | ICD-10-CM | POA: Diagnosis not present

## 2020-07-10 DIAGNOSIS — C73 Malignant neoplasm of thyroid gland: Secondary | ICD-10-CM | POA: Diagnosis not present

## 2020-07-10 DIAGNOSIS — E890 Postprocedural hypothyroidism: Secondary | ICD-10-CM | POA: Diagnosis not present

## 2020-08-29 ENCOUNTER — Other Ambulatory Visit: Payer: Self-pay

## 2020-08-29 ENCOUNTER — Encounter: Payer: Self-pay | Admitting: Surgical

## 2020-08-29 ENCOUNTER — Ambulatory Visit (INDEPENDENT_AMBULATORY_CARE_PROVIDER_SITE_OTHER): Payer: BC Managed Care – PPO | Admitting: Surgical

## 2020-08-29 DIAGNOSIS — M1711 Unilateral primary osteoarthritis, right knee: Secondary | ICD-10-CM | POA: Diagnosis not present

## 2020-08-30 ENCOUNTER — Encounter: Payer: Self-pay | Admitting: Surgical

## 2020-08-30 DIAGNOSIS — M1711 Unilateral primary osteoarthritis, right knee: Secondary | ICD-10-CM | POA: Diagnosis not present

## 2020-08-30 MED ORDER — BUPIVACAINE HCL 0.25 % IJ SOLN
4.0000 mL | INTRAMUSCULAR | Status: AC | PRN
  Administered 2020-08-30: 4 mL via INTRA_ARTICULAR

## 2020-08-30 MED ORDER — METHYLPREDNISOLONE ACETATE 40 MG/ML IJ SUSP
40.0000 mg | INTRAMUSCULAR | Status: AC | PRN
  Administered 2020-08-30: 40 mg via INTRA_ARTICULAR

## 2020-08-30 MED ORDER — LIDOCAINE HCL 1 % IJ SOLN
5.0000 mL | INTRAMUSCULAR | Status: AC | PRN
  Administered 2020-08-30: 5 mL

## 2020-08-30 NOTE — Progress Notes (Signed)
Office Visit Note   [name]           Date of Birth: [birth year]           MRN: [id number] Visit Date: 08/29/2020 Requested by: Alroy Dust, L.Marlou Sa, [place] [address] [place],  [place] [address] PCP: Alroy Dust, L.Marlou Sa, MD  Subjective: Chief Complaint  Patient presents with  . Right Knee - Pain    HPI: Ricardo House is a 50 y.o. male who presents to the office complaining of right knee pain.  Patient returns for evaluation of right knee pain.  He states his right knee is doing better overall.  He reports decreased range of motion compared to the left knee but overall his range of motion of the right knee seems improved compared with how it used to feel.  He has dropped 15 pounds using an elliptical which is better for him than a stationary bike as he feels he does not have the range of motion in his knee for stationary bike.  He is requesting aspiration today after fluid return 2 weeks following the last visit..                ROS: All systems reviewed are negative as they relate to the chief complaint within the history of present illness.  Patient denies fevers or chills.  Assessment & Plan: Visit Diagnoses:  1. Unilateral primary osteoarthritis, right knee     Plan: Patient is a 50 year old male who presents complaint of right knee pain.  Has history of right knee arthritis.  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  He is here today requesting aspiration and injection.  20 cc of inflammatory fluid was aspirated from the right knee.  Cortisone injection successfully delivered and patient tolerated the procedure well.  He is okay to follow-up as needed.  He has upcoming motorcycle trip with one of his friends.  Wants to avoid surgery as long as possible.  Follow-Up Instructions: No follow-ups on file.   Orders:  No orders of the defined types were placed in this encounter.  No orders of the defined types  were placed in this encounter.     Procedures: Large Joint Inj: R knee on 08/30/2020 11:49 AM Indications: diagnostic evaluation, joint swelling and pain Details: 18 G 1.5 in needle, superolateral approach  Arthrogram: No  Medications: 5 mL lidocaine 1 %; 40 mg methylPREDNISolone acetate 40 MG/ML; 4 mL bupivacaine 0.25 % Aspirate: 20 mL Outcome: tolerated well, no immediate complications Procedure, treatment alternatives, risks and benefits explained, specific risks discussed. Consent was given by the patient. Immediately prior to procedure a time out was called to verify the correct patient, procedure, equipment, support staff and site/side marked as required. Patient was prepped and draped in the usual sterile fashion.       Clinical Data: No additional findings.  Objective: Vital Signs: There were no vitals taken for this visit.  Physical Exam:  Constitutional: Patient appears well-developed HEENT:  Head: Normocephalic Eyes:EOM are normal Neck: Normal range of motion Cardiovascular: Normal rate Pulmonary/chest: Effort normal Neurologic: Patient is alert Skin: Skin is warm Psychiatric: Patient has normal mood and affect  Ortho Exam: Ortho exam demonstrates right knee with effusion.  10 degree flexion contracture noted.  Flexes to 125 degrees.  No calf tenderness.  Negative Homans' sign.  No pain with hip range of motion.  Tenderness over the medial and lateral  joint lines.  No significant alignment deformity noted.  Specialty Comments:  No specialty comments available.  Imaging: No results found.   PMFS History: Patient Active Problem List   Diagnosis Date Noted  . 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 Thyroid cancer ([facility])     History reviewed. No pertinent family history.  Past Surgical History:  Procedure Laterality Date  . KNEE SURGERY Bilateral   . THYROIDECTOMY N/A 01/02/2020   Procedure: TOTAL  THYROIDECTOMY WITH LIMITED LYMPH NODE DISSECTION;  Surgeon: Armandina Gemma, MD;  Location: [facility];  Service: General;  Laterality: N/A;  . WISDOM TOOTH EXTRACTION     Social History   Occupational History  . Not on file  Tobacco Use  . Smoking status: Never Smoker  . Smokeless tobacco: Never Used  Vaping Use  . Vaping Use: Never used  Substance and Sexual Activity  . Alcohol use: Yes    Alcohol/week: 1.0 standard drink    Types: 1 Cans of beer per week    Comment: 3 days a week  . Drug use: Not on file  . Sexual activity: Not on file

## 2020-09-16 IMAGING — CR DG CHEST 2V
2 series · 2 of 2 positions shown · non-contrast
Comparison: None.

CLINICAL DATA: Preop thyroidectomy

EXAM:
CHEST - 2 VIEW

[w chest pa]
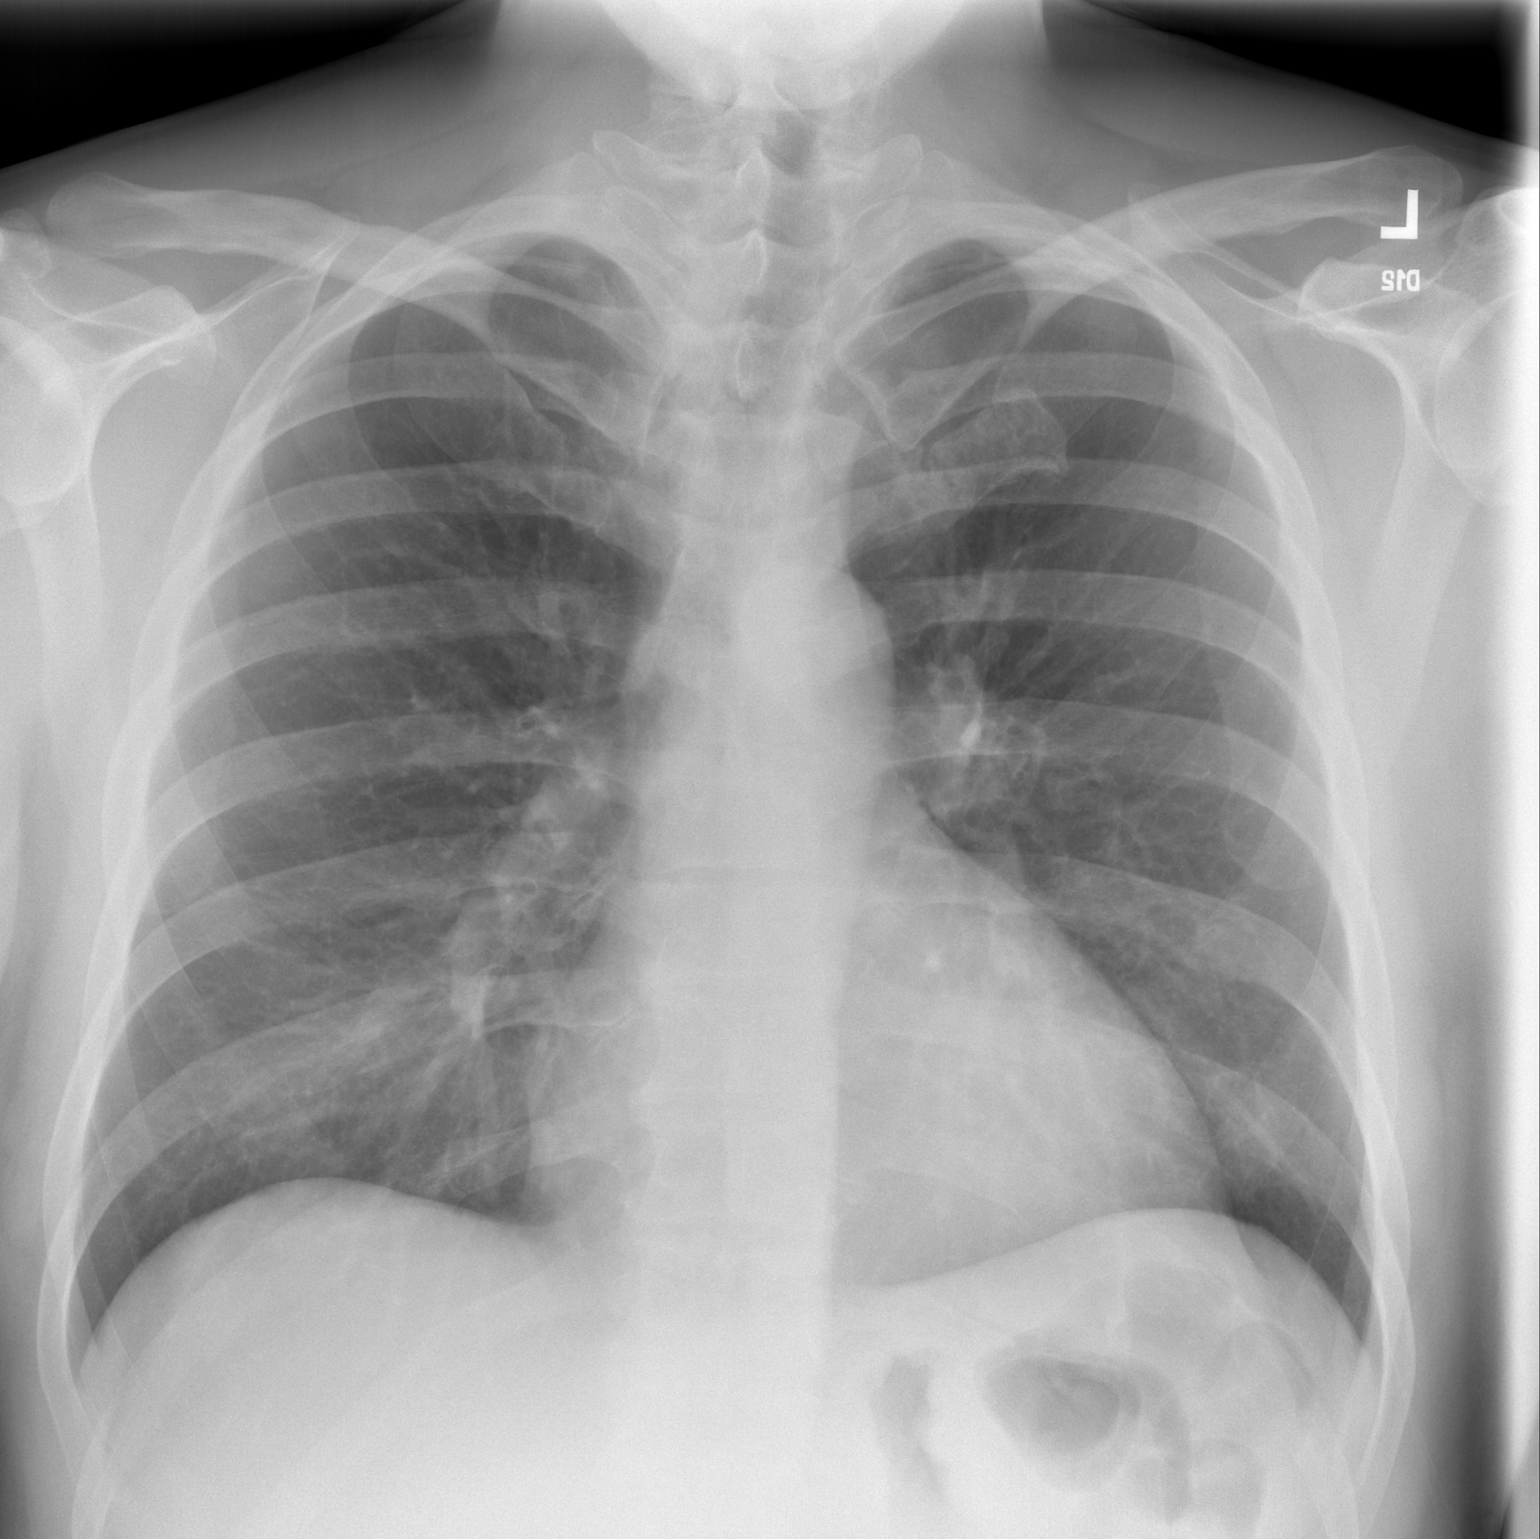

[w chest lat]
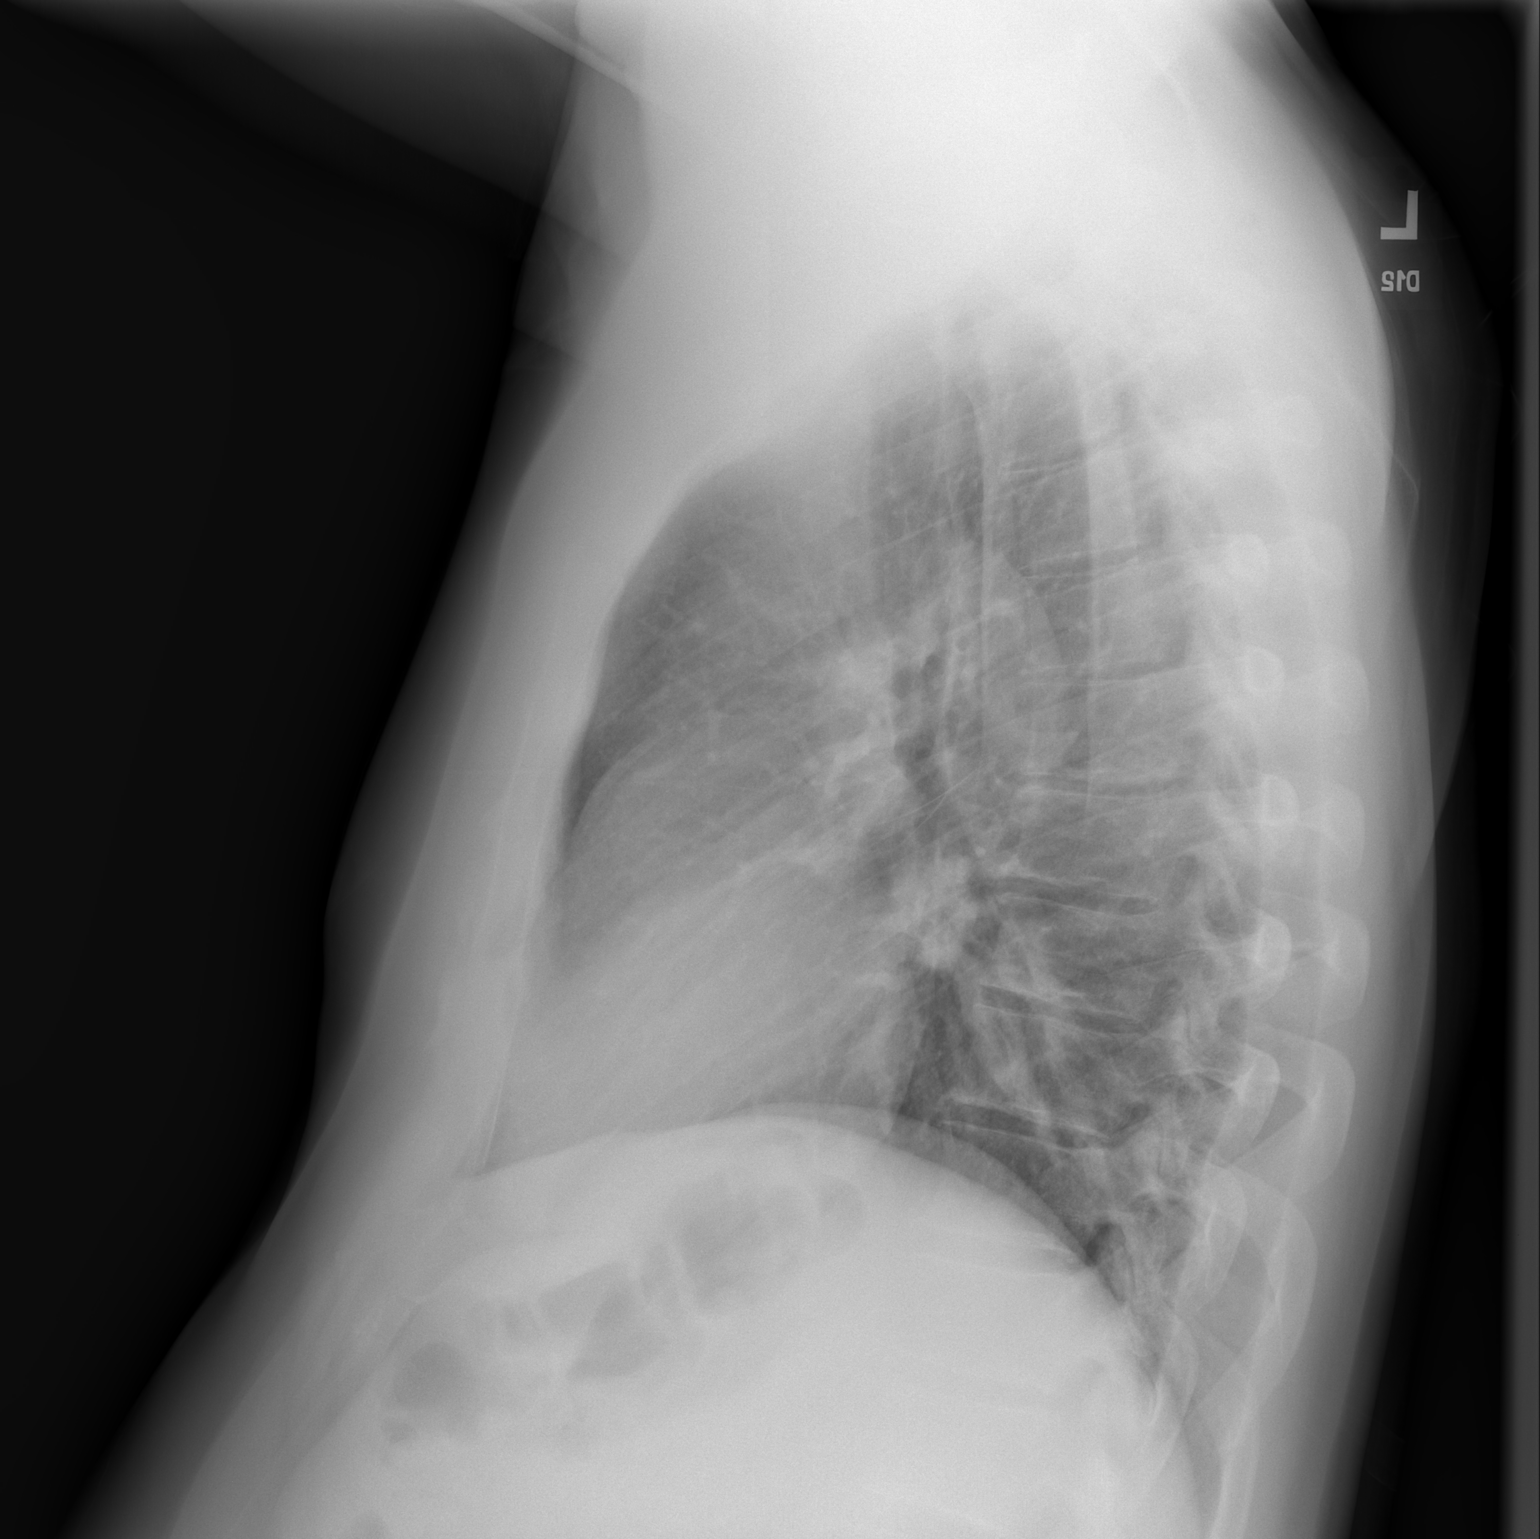

[2 of 2 positions shown; findings below may reference images not displayed]

FINDINGS: The heart size and mediastinal contours are within normal limits.
Both lungs are clear. The visualized skeletal structures are
unremarkable.
IMPRESSION: No active cardiopulmonary disease.

## 2020-10-31 DIAGNOSIS — C73 Malignant neoplasm of thyroid gland: Secondary | ICD-10-CM | POA: Diagnosis not present

## 2020-10-31 DIAGNOSIS — E890 Postprocedural hypothyroidism: Secondary | ICD-10-CM | POA: Diagnosis not present

## 2020-11-06 DIAGNOSIS — E890 Postprocedural hypothyroidism: Secondary | ICD-10-CM | POA: Diagnosis not present

## 2020-11-06 DIAGNOSIS — Z85850 Personal history of malignant neoplasm of thyroid: Secondary | ICD-10-CM | POA: Diagnosis not present

## 2020-11-06 DIAGNOSIS — Z9889 Other specified postprocedural states: Secondary | ICD-10-CM | POA: Diagnosis not present

## 2021-01-07 DIAGNOSIS — E890 Postprocedural hypothyroidism: Secondary | ICD-10-CM | POA: Diagnosis not present

## 2021-03-03 DIAGNOSIS — E890 Postprocedural hypothyroidism: Secondary | ICD-10-CM | POA: Diagnosis not present

## 2021-04-08 ENCOUNTER — Ambulatory Visit (INDEPENDENT_AMBULATORY_CARE_PROVIDER_SITE_OTHER): Payer: BC Managed Care – PPO | Admitting: Orthopedic Surgery

## 2021-04-08 ENCOUNTER — Encounter: Payer: Self-pay | Admitting: Orthopedic Surgery

## 2021-04-08 ENCOUNTER — Ambulatory Visit: Payer: Self-pay

## 2021-04-08 ENCOUNTER — Other Ambulatory Visit: Payer: Self-pay

## 2021-04-08 DIAGNOSIS — M1711 Unilateral primary osteoarthritis, right knee: Secondary | ICD-10-CM

## 2021-04-08 DIAGNOSIS — M25562 Pain in left knee: Secondary | ICD-10-CM | POA: Diagnosis not present

## 2021-04-08 DIAGNOSIS — M7122 Synovial cyst of popliteal space [Baker], left knee: Secondary | ICD-10-CM | POA: Diagnosis not present

## 2021-04-08 DIAGNOSIS — M170 Bilateral primary osteoarthritis of knee: Secondary | ICD-10-CM | POA: Diagnosis not present

## 2021-04-08 DIAGNOSIS — M1712 Unilateral primary osteoarthritis, left knee: Secondary | ICD-10-CM | POA: Diagnosis not present

## 2021-04-08 MED ORDER — LIDOCAINE HCL 1 % IJ SOLN
5.0000 mL | INTRAMUSCULAR | Status: AC | PRN
  Administered 2021-04-08: 13:00:00 5 mL

## 2021-04-08 MED ORDER — METHYLPREDNISOLONE ACETATE 40 MG/ML IJ SUSP
40.0000 mg | INTRAMUSCULAR | Status: AC | PRN
Start: 2021-04-08 — End: 2021-04-08
  Administered 2021-04-08: 13:00:00 40 mg via INTRA_ARTICULAR

## 2021-04-08 MED ORDER — BUPIVACAINE HCL 0.25 % IJ SOLN
4.0000 mL | INTRAMUSCULAR | Status: AC | PRN
Start: 2021-04-08 — End: 2021-04-08
  Administered 2021-04-08: 13:00:00 4 mL via INTRA_ARTICULAR

## 2021-04-08 MED ORDER — LIDOCAINE HCL 1 % IJ SOLN
5.0000 mL | INTRAMUSCULAR | Status: AC | PRN
Start: 2021-04-08 — End: 2021-04-08
  Administered 2021-04-08: 13:00:00 5 mL

## 2021-04-08 NOTE — Progress Notes (Signed)
Office Visit Note   [name]           Date of Birth: February 25, 1971           MRN: [id number] Visit Date: 04/08/2021 Requested by: Alroy Dust, L.Marlou Sa, [place] [address] [place],   [address] PCP: Alroy Dust, L.Marlou Sa, MD  Subjective: Chief Complaint  Patient presents with   Left Knee - Pain   Right Knee - Pain    HPI: Ricardo House is a 49 year old patient with bilateral knee pain.  Has known history of bilateral medial compartment knee arthritis.  Has lost 15 pounds since last visit and that helped him a lot but he was not able to continue working out on the elliptical at [employer].  With the change in elliptical his knee pain recurred.  He recently bought a used elliptical for his home.  Has not used too much yet.  Occasionally takes over-the-counter medication.  Overall he is improved.  However he does report right knee pain with significant effusion.  Also describes left knee effusion and pain which is unusual for him.  Also describes some posterior left knee pain.  Denies any history of injury.              ROS: All systems reviewed are negative as they relate to the chief complaint within the history of present illness.  Patient denies  fevers or chills.   Assessment & Plan: Visit Diagnoses:  1. Left knee pain, unspecified chronicity     Plan: Impression is bilateral knee arthritis with flexion contractures bilaterally.  Baker's cyst present in the back of the left knee.  Both knees aspirated and injected today.  We also aspirated the Baker's cyst behind the left knee.  Continue with nonweightbearing quad strengthening exercises and follow-up as needed.  May need knee replacement at sometime in the future but we want to put that off as long as possible.  I do not think he is a candidate for partial knee replacement based on his flexion contractures bilaterally.  Follow-Up Instructions: Return if symptoms worsen or fail to improve.   Orders:  Orders Placed This  Encounter  Procedures   US Guided Needle Placement - No Linked Charges   No orders of the defined types were placed in this encounter.     Procedures: Large Joint Inj: R knee on 04/08/2021 1:24 PM Indications: diagnostic evaluation, joint swelling and pain Details: 18 G 1.5 in needle, superolateral approach  Arthrogram: No  Medications: 5 mL lidocaine 1 %; 40 mg methylPREDNISolone acetate 40 MG/ML; 4 mL bupivacaine 0.25 % Outcome: tolerated well, no immediate complications Procedure, treatment alternatives, risks and benefits explained, specific risks discussed. Consent was given by the patient. Immediately prior to procedure a time out was called to verify the correct patient, procedure, equipment, support staff and site/side marked as required. Patient was prepped and draped in the usual sterile fashion.    Large Joint Inj: L knee on 04/08/2021 1:25 PM Indications: diagnostic evaluation, joint swelling and pain Details: 18 G 1.5 in needle, superolateral approach  Arthrogram: No  Medications: 5 mL lidocaine 1 %; 40 mg methylPREDNISolone acetate 40 MG/ML; 4 mL bupivacaine 0.25 % Outcome: tolerated well, no immediate complications Procedure, treatment alternatives, risks and benefits explained, specific risks discussed. Consent was given by the patient. Immediately prior to procedure a time out was called to verify the correct patient, procedure, equipment, support staff and site/side marked as required. Patient was prepped and draped in  the usual sterile fashion.    Large Joint Inj: L knee on 04/08/2021 1:25 PM Indications: diagnostic evaluation, joint swelling and pain Details: 18 G 1.5 in needle, ultrasound-guided posterior approach  Arthrogram: No  Medications: 5 mL lidocaine 1 % Aspirate: 35 mL serous Outcome: tolerated well, no immediate complications Procedure, treatment alternatives, risks and benefits explained, specific risks discussed. Consent was given by the  patient. Immediately prior to procedure a time out was called to verify the correct patient, procedure, equipment, support staff and site/side marked as required. Patient was prepped and draped in the usual sterile fashion.   Left knee aspirated under ultrasound Baker's cyst.  This was posterior approach.  Superolateral approach in the left knee for aspiration and injection of the joint.   Clinical Data: No additional findings.  Objective: Vital Signs: There were no vitals taken for this visit.  Physical Exam:   Constitutional: Patient appears well-developed HEENT:  Head: Normocephalic Eyes:EOM are normal Neck: Normal range of motion Cardiovascular: Normal rate Pulmonary/chest: Effort normal Neurologic: Patient is alert Skin: Skin is warm Psychiatric: Patient has normal mood and affect   Ortho Exam: Ortho exam demonstrates full active and passive range of motion of the hips and ankles.  Has about a 5 to 7 degree flexion contracture bilaterally.  Mild effusion to moderate effusion present in both knees.  Baker's cyst palpable behind the left knee.  Extensor mechanism intact.  Mild patellofemoral crepitus present.  No other masses lymphadenopathy or skin changes noted in bilateral knee region.  Specialty Comments:  No specialty comments available.  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 No pertinent family history.  Past Surgical History:  Procedure Laterality Date   KNEE SURGERY Bilateral    THYROIDECTOMY N/A 01/02/2020   Procedure: TOTAL THYROIDECTOMY WITH LIMITED LYMPH NODE DISSECTION;   Surgeon: Armandina Gemma, MD;  Location: [facility];  Service: General;  Laterality: N/A;   WISDOM TOOTH EXTRACTION     Social History   Occupational History   Not on file  Tobacco Use   Smoking status: Never   Smokeless tobacco: Never  Vaping Use   Vaping Use: Never used  Substance and Sexual Activity   Alcohol use: Yes    Alcohol/week: 1.0 standard drink    Types: 1 Cans of beer per week    Comment: 3 days a week   Drug use: Not on file   Sexual activity: Not on file

## 2021-07-17 DIAGNOSIS — E890 Postprocedural hypothyroidism: Secondary | ICD-10-CM | POA: Diagnosis not present

## 2021-09-08 DIAGNOSIS — E890 Postprocedural hypothyroidism: Secondary | ICD-10-CM | POA: Diagnosis not present

## 2021-09-22 ENCOUNTER — Encounter: Payer: Self-pay | Admitting: Surgical

## 2021-09-22 ENCOUNTER — Ambulatory Visit (INDEPENDENT_AMBULATORY_CARE_PROVIDER_SITE_OTHER): Payer: BC Managed Care – PPO | Admitting: Surgical

## 2021-09-22 DIAGNOSIS — M170 Bilateral primary osteoarthritis of knee: Secondary | ICD-10-CM

## 2021-09-22 DIAGNOSIS — M1712 Unilateral primary osteoarthritis, left knee: Secondary | ICD-10-CM

## 2021-09-22 DIAGNOSIS — M1711 Unilateral primary osteoarthritis, right knee: Secondary | ICD-10-CM

## 2021-09-22 MED ORDER — LIDOCAINE HCL 1 % IJ SOLN
5.0000 mL | INTRAMUSCULAR | Status: AC | PRN
  Administered 2021-09-22: 5 mL

## 2021-09-22 MED ORDER — METHYLPREDNISOLONE ACETATE 40 MG/ML IJ SUSP
40.0000 mg | INTRAMUSCULAR | Status: AC | PRN
  Administered 2021-09-22: 40 mg via INTRA_ARTICULAR

## 2021-09-22 MED ORDER — BUPIVACAINE HCL 0.25 % IJ SOLN
4.0000 mL | INTRAMUSCULAR | Status: AC | PRN
  Administered 2021-09-22: 4 mL via INTRA_ARTICULAR

## 2021-09-22 NOTE — Progress Notes (Signed)
Office Visit Note   [name]           Date of Birth: 1970-05-25           MRN: [id number] Visit Date: 09/22/2021 Requested by: Ricardo House, L.Ricardo House, [place] [employer] [address],  [place] [address] PCP: Ricardo House, L.Ricardo Sa, MD  Subjective: Chief Complaint  Patient presents with   Right Knee - Pain   Left Knee - Pain    HPI: Ricardo House is a 51 y.o. male who presents to the office complaining of bilateral knee pain.  He has history of knee arthritis.  Left knee pain is worsening.  His last injections were in December 2022.  These have done pretty well for him and provide about 4 to 5 months of relief.  He is still using elliptical for about 30 minutes at a time 4 to 5 days/week which helps with his knee pain.  If he does not do the elliptical, his knee pain gets worse.  Right knee is swelling up again but the left knee not so much..                ROS: All systems reviewed are negative as they relate to the chief complaint within the history of present illness.  Patient denies fevers or chills.  Assessment & Plan: Visit Diagnoses:  1. Primary osteoarthritis of both knees     Plan: Patient is a 51 year old male who presents for evaluation of bilateral knee pain.  Left knee pain is getting worse.  He is here today for bilateral knee aspiration and injection.  Left knee without any effusion so no aspiration for that knee.  Right knee was aspirated of 23 cc of nonpurulent synovial fluid.  Injections administered in both knees.  Follow-up with the office as needed.  Follow-Up Instructions: Return if symptoms worsen or fail to improve.   Orders:  No orders of the defined types were placed in this encounter.  No orders of the defined types were placed in this encounter.     Procedures: Large Joint Inj: bilateral knee on 09/22/2021 10:53 AM Indications: diagnostic evaluation, joint swelling and pain Details: 18 G 1.5 in needle, superolateral approach  Arthrogram:  No  Medications (Right): 5 mL lidocaine 1 %; 4 mL bupivacaine 0.25 %; 40 mg methylPREDNISolone acetate 40 MG/ML Aspirate (Right): 23 mL Medications (Left): 5 mL lidocaine 1 %; 4 mL bupivacaine 0.25 %; 40 mg methylPREDNISolone acetate 40 MG/ML Outcome: tolerated well, no immediate complications Procedure, treatment alternatives, risks and benefits explained, specific risks discussed. Consent was given by the patient. Immediately prior to procedure a time out was called to verify the correct patient, procedure, equipment, support staff and site/side marked as required. Patient was prepped and draped in the usual sterile fashion.      Clinical Data: No additional findings.  Objective: Vital Signs: There were no vitals taken for this visit.  Physical Exam:  Constitutional: Patient appears well-developed HEENT:  Head: Normocephalic Eyes:EOM are normal Neck: Normal range of motion Cardiovascular: Normal rate Pulmonary/chest: Effort normal Neurologic: Patient is alert Skin: Skin is warm Psychiatric: Patient has normal mood and affect  Ortho Exam: Ortho exam demonstrates right knee with positive effusion.  Left knee with no effusion.  Able to perform straight leg raise with both legs.  Right knee with 8 degree flexion contracture.  Left knee with 5 degree flexion contracture.  No calf tenderness bilaterally.  Negative Homans' sign bilaterally.  Tenderness over the medial  joint line bilaterally.  Not as much tenderness over the lateral joint line bilaterally.  No pain with hip range of motion.  No cellulitis or skin changes noted.  Specialty Comments:  No specialty comments available.  Imaging: No results found.   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 No pertinent family history.  Past Surgical History:  Procedure  Laterality Date   KNEE SURGERY Bilateral    THYROIDECTOMY N/A 01/02/2020   Procedure: TOTAL THYROIDECTOMY WITH LIMITED LYMPH NODE DISSECTION;  Surgeon: Armandina Gemma, MD;  Location: [facility];  Service: General;  Laterality: N/A;   WISDOM TOOTH EXTRACTION     Social History   Occupational History   Not on file  Tobacco Use   Smoking status: Never   Smokeless tobacco: Never  Vaping Use   Vaping Use: Never used  Substance and Sexual Activity   Alcohol use: Yes    Alcohol/week: 1.0 standard drink    Types: 1 Cans of beer per week    Comment: 3 days a week   Drug use: Not on file   Sexual activity: Not on file

## 2021-11-04 DIAGNOSIS — E890 Postprocedural hypothyroidism: Secondary | ICD-10-CM | POA: Diagnosis not present

## 2021-11-04 DIAGNOSIS — Z85850 Personal history of malignant neoplasm of thyroid: Secondary | ICD-10-CM | POA: Diagnosis not present

## 2021-11-06 DIAGNOSIS — E890 Postprocedural hypothyroidism: Secondary | ICD-10-CM | POA: Diagnosis not present

## 2021-11-06 DIAGNOSIS — Z9889 Other specified postprocedural states: Secondary | ICD-10-CM | POA: Diagnosis not present

## 2021-11-06 DIAGNOSIS — Z85850 Personal history of malignant neoplasm of thyroid: Secondary | ICD-10-CM | POA: Diagnosis not present

## 2022-01-07 ENCOUNTER — Other Ambulatory Visit: Payer: Self-pay | Admitting: Family Medicine

## 2022-01-07 DIAGNOSIS — M549 Dorsalgia, unspecified: Secondary | ICD-10-CM

## 2022-01-07 DIAGNOSIS — L821 Other seborrheic keratosis: Secondary | ICD-10-CM | POA: Diagnosis not present

## 2022-01-08 ENCOUNTER — Ambulatory Visit
Admission: RE | Admit: 2022-01-08 | Discharge: 2022-01-08 | Disposition: A | Discharge status: Home / Self Care | Payer: BC Managed Care – PPO | Source: Ambulatory Visit | Attending: Family Medicine | Admitting: Family Medicine

## 2022-01-08 DIAGNOSIS — M549 Dorsalgia, unspecified: Secondary | ICD-10-CM

## 2022-01-08 DIAGNOSIS — N281 Cyst of kidney, acquired: Secondary | ICD-10-CM | POA: Diagnosis not present

## 2022-01-12 ENCOUNTER — Other Ambulatory Visit: Payer: Self-pay | Admitting: Family Medicine

## 2022-01-12 DIAGNOSIS — R9389 Abnormal findings on diagnostic imaging of other specified body structures: Secondary | ICD-10-CM

## 2022-01-19 ENCOUNTER — Ambulatory Visit
Admission: RE | Admit: 2022-01-19 | Discharge: 2022-01-19 | Disposition: A | Discharge status: Home / Self Care | Payer: BC Managed Care – PPO | Source: Ambulatory Visit | Attending: Family Medicine | Admitting: Family Medicine

## 2022-01-19 DIAGNOSIS — N281 Cyst of kidney, acquired: Secondary | ICD-10-CM | POA: Diagnosis not present

## 2022-01-19 DIAGNOSIS — I700 Atherosclerosis of aorta: Secondary | ICD-10-CM | POA: Diagnosis not present

## 2022-01-19 DIAGNOSIS — N2889 Other specified disorders of kidney and ureter: Secondary | ICD-10-CM | POA: Diagnosis not present

## 2022-01-19 DIAGNOSIS — R9389 Abnormal findings on diagnostic imaging of other specified body structures: Secondary | ICD-10-CM

## 2022-01-19 DIAGNOSIS — R188 Other ascites: Secondary | ICD-10-CM | POA: Diagnosis not present

## 2022-01-19 MED ORDER — IOPAMIDOL (ISOVUE-300) INJECTION 61%
100.0000 mL | Freq: Once | INTRAVENOUS | Status: AC | PRN
  Administered 2022-01-19: 100 mL via INTRAVENOUS

## 2022-02-09 DIAGNOSIS — Z23 Encounter for immunization: Secondary | ICD-10-CM | POA: Diagnosis not present

## 2022-02-09 DIAGNOSIS — Z0000 Encounter for general adult medical examination without abnormal findings: Secondary | ICD-10-CM | POA: Diagnosis not present

## 2022-02-09 DIAGNOSIS — Z13220 Encounter for screening for lipoid disorders: Secondary | ICD-10-CM | POA: Diagnosis not present

## 2022-04-08 DIAGNOSIS — Z13220 Encounter for screening for lipoid disorders: Secondary | ICD-10-CM | POA: Diagnosis not present

## 2022-04-29 DIAGNOSIS — K4090 Unilateral inguinal hernia, without obstruction or gangrene, not specified as recurrent: Secondary | ICD-10-CM | POA: Diagnosis not present

## 2022-05-14 ENCOUNTER — Ambulatory Visit: Payer: Self-pay | Admitting: Surgery

## 2022-05-14 DIAGNOSIS — K4090 Unilateral inguinal hernia, without obstruction or gangrene, not specified as recurrent: Secondary | ICD-10-CM | POA: Diagnosis not present

## 2022-05-14 NOTE — H&P (Signed)
Ricardo House [id number]    Referring Provider:  Hammer, Eli, MD     Subjective    Chief Complaint: New Consultation and Inguinal Hernia       History of Present Illness:    Very pleasant 52-year-old male with history of thyroid cancer that is post thyroidectomy in 2021 by Dr. Gerkin who presents for evaluation of a possible right inguinal hernia.  About 2 months ago he developed a sensation of pressure in the right groin and focal discomfort, as well as a visible protrusion in the area.  Notes that discomfort radiates into the scrotum but is usually more in the groin area.  Denies any obstructive or urinary symptoms.  Symptoms have not really changed much, he states the last 3 to 4 days he has not had much in the way of pain.   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  He does have a ski trip coming up at the beginning of February.     Review of Systems: A complete review of systems was obtained from the patient.  I have reviewed this information and discussed as appropriate with the patient.  See HPI as well for other ROS.     Medical History:     Past Medical History:  Diagnosis Date   Thyroid disease        There is no problem list on file for this patient.          Past Surgical History:  Procedure Laterality Date   knee surgerey       thyroid surgery          No Known Allergies         Current Outpatient Medications on File Prior to Visit  Medication Sig Dispense Refill   levothyroxine (SYNTHROID) 150 MCG tablet TAKE 1 TABLET BY MOUTH EVERY DAY IN THE MORNING ON AN EMPTY STOMACH       loratadine (CLARITIN) 10 mg tablet 1 tablet Orally Once a day as needed        No current facility-administered medications on file prior to visit.           Family History  Problem Relation Age of Onset   High blood pressure (Hypertension) Mother        Social History       Tobacco Use  Smoking Status Never  Smokeless Tobacco Never       Social History         Socioeconomic History   Marital status: Married  Tobacco Use   Smoking status: Never   Smokeless tobacco: Never  Substance and Sexual Activity   Alcohol use: Yes      Comment: social   Drug use: Never      Objective:         Vitals:    05/14/22 1147  BP: 131/88  Pulse: 78  Temp: 36.7 C (98 F)  SpO2: 98%  Weight: (!) 122.5 kg (270 lb)  Height: 195.6 cm (6' 5")    Body mass index is 32.02 kg/m.   Gen: A&Ox3, no distress  Unlabored respiration Abdomen soft, nontender, nondistended.  Tender partially reducible right inguinal hernia without overlying skin change.  No left inguinal hernia.   Assessment and Plan:  Diagnoses and all orders for this visit:   Non-recurrent unilateral inguinal hernia without obstruction or gangrene   We discussed the relevant anatomy and options for repair including open and minimally invasive.  I recommend   an open repair and we went over the surgery and discussed risks of bleeding, infection, pain, scarring, injury to structures in the area including nerves, blood vessels, bowel, bladder, risk of chronic pain, hernia recurrence, risk of seroma or hematoma, urinary retention, and risks of general anesthesia including cardiovascular, pulmonary, and thromboembolic complications.  Questions were answered.  Patient wishes to proceed with scheduling.  I did recommend that he postpone surgery until after his ski trip, because he will not be able to ski so soon after hernia surgery.  We discussed signs and symptoms of incarceration/strangulation to monitor for and what would merit presenting for emergency evaluation.       Iridian Reader AMANDA Lailani Tool, MD   

## 2022-06-14 NOTE — Patient Instructions (Signed)
SURGICAL WAITING ROOM VISITATION  Patients having surgery or a procedure may have no more than 2 support people in the waiting area - these visitors may rotate.    Children under the age of 43 must have an adult with them who is not the patient.  Due to an increase in RSV and influenza rates and associated hospitalizations, children ages 71 and under may not visit patients in [facility] hospitals.  If the patient needs to stay at the hospital during part of their recovery, the visitor guidelines for inpatient rooms apply. Pre-op nurse will coordinate an appropriate time for 1 support person to accompany patient in pre-op.  This support person may not rotate.    Please refer to the [facility] website for the visitor guidelines for Inpatients (after your surgery is over and you are in a regular room).    Your procedure is scheduled on: 06/25/22   Report to [facility] Main Entrance    Report to admitting at 5:15 AM   Call this number if you have problems the morning of surgery [phone]   Do not eat food or drink liquids :After Midnight.          If you have questions, please contact your surgeon's office.   FOLLOW BOWEL PREP AND ANY ADDITIONAL PRE OP INSTRUCTIONS YOU RECEIVED FROM YOUR SURGEON'S OFFICE!!!     Oral Hygiene is also important to reduce your risk of infection.                                    Remember - BRUSH YOUR TEETH THE MORNING OF SURGERY WITH YOUR REGULAR TOOTHPASTE  DENTURES WILL BE REMOVED PRIOR TO SURGERY PLEASE DO NOT APPLY "Poly grip" OR ADHESIVES!!!   Take these medicines the morning of surgery with A SIP OF WATER: Levothyroxine                               You may not have any metal on your body including jewelry, and body piercing             Do not wear lotions, powders, cologne, or deodorant  Do not shave  48 hours prior to surgery.               Men may shave face and neck.   Do not bring valuables to the hospital. [facility] IS  NOT             RESPONSIBLE   FOR VALUABLES.   Contacts, glasses, dentures or bridgework may not be worn into surgery.  DO NOT BRING YOUR HOME MEDICATIONS TO THE HOSPITAL. PHARMACY WILL DISPENSE MEDICATIONS LISTED ON YOUR MEDICATION LIST TO YOU DURING YOUR ADMISSION IN THE HOSPITAL!    Patients discharged on the day of surgery will not be allowed to drive home.  Someone NEEDS to stay with you for the first 24 hours after anesthesia.              Please read over the following fact sheets you were given: IF YOU HAVE QUESTIONS ABOUT YOUR PRE-OP INSTRUCTIONS PLEASE CALL [name]    If you received a COVID test during your pre-op visit  it is requested that you wear a mask when out in public, stay away from anyone that may not be feeling well and notify your surgeon if you  develop symptoms. If you test positive for Covid or have been in contact with anyone that has tested positive in the last 10 days please notify you surgeon.    [facility] - Preparing for Surgery Before surgery, you can play an important role.  Because skin is not sterile, your skin needs to be as free of germs as possible.  You can reduce the number of germs on your skin by washing with CHG (chlorahexidine gluconate) soap before surgery.  CHG is an antiseptic cleaner which kills germs and bonds with the skin to continue killing germs even after washing. Please DO NOT use if you have an allergy to CHG or antibacterial soaps.  If your skin becomes reddened/irritated stop using the CHG and inform your nurse when you arrive at Short Stay. Do not shave (including legs and underarms) for at least 48 hours prior to the first CHG shower.  You may shave your face/neck.  Please follow these instructions carefully:  1.  Shower with CHG Soap the night before surgery and the  morning of surgery.  2.  If you choose to wash your hair, wash your hair first as usual with your normal  shampoo.  3.  After you shampoo, rinse your hair  and body thoroughly to remove the shampoo.                             4.  Use CHG as you would any other liquid soap.  You can apply chg directly to the skin and wash.  Gently with a scrungie or clean washcloth.  5.  Apply the CHG Soap to your body ONLY FROM THE NECK DOWN.   Do   not use on face/ open                           Wound or open sores. Avoid contact with eyes, ears mouth and   genitals (private parts).                       Wash face,  Genitals (private parts) with your normal soap.             6.  Wash thoroughly, paying special attention to the area where your    surgery  will be performed.  7.  Thoroughly rinse your body with warm water from the neck down.  8.  DO NOT shower/wash with your normal soap after using and rinsing off the CHG Soap.                9.  Pat yourself dry with a clean towel.            10.  Wear clean pajamas.            11.  Place clean sheets on your bed the night of your first shower and do not  sleep with pets. Day of Surgery : Do not apply any lotions/deodorants the morning of surgery.  Please wear clean clothes to the hospital/surgery center.  FAILURE TO FOLLOW THESE INSTRUCTIONS MAY RESULT IN THE CANCELLATION OF YOUR SURGERY  PATIENT SIGNATURE_________________________________  NURSE SIGNATURE__________________________________  ________________________________________________________________________

## 2022-06-14 NOTE — Progress Notes (Signed)
COVID Vaccine Completed:  Date of COVID positive in last 90 days:  PCP - Donnie Coffin ,MD Cardiologist -   Chest x-ray -  EKG -  Stress Test -  ECHO -  Cardiac Cath -  Pacemaker/ICD device last checked: Spinal Cord Stimulator:  Bowel Prep -   Sleep Study -  CPAP -   Fasting Blood Sugar -  Checks Blood Sugar _____ times a day  Last dose of GLP1 agonist-  N/A GLP1 instructions:  N/A   Last dose of SGLT-2 inhibitors-  N/A SGLT-2 instructions: N/A   Blood Thinner Instructions: Aspirin Instructions: Last Dose:  Activity level:  Can go up a flight of stairs and perform activities of daily living without stopping and without symptoms of chest pain or shortness of breath.  Able to exercise without symptoms  Unable to go up a flight of stairs without symptoms of     Anesthesia review:   Patient denies shortness of breath, fever, cough and chest pain at PAT appointment  Patient verbalized understanding of instructions that were given to them at the PAT appointment. Patient was also instructed that they will need to review over the PAT instructions again at home before surgery.

## 2022-06-15 ENCOUNTER — Encounter (HOSPITAL_COMMUNITY): Payer: Self-pay

## 2022-06-15 ENCOUNTER — Encounter (HOSPITAL_COMMUNITY)
Admission: RE | Admit: 2022-06-15 | Discharge: 2022-06-15 | Disposition: A | Discharge status: Home / Self Care | Payer: BC Managed Care – PPO | Source: Ambulatory Visit | Attending: Surgery | Admitting: Surgery

## 2022-06-15 VITALS — BP 146/88 | HR 65 | Temp 97.8°F | Resp 14 | Ht 77.0 in | Wt 269.0 lb

## 2022-06-15 DIAGNOSIS — Z01818 Encounter for other preprocedural examination: Secondary | ICD-10-CM

## 2022-06-15 LAB — CBC
HCT: 42.8 % (ref 39.0–52.0)
Hemoglobin: 14.5 g/dL (ref 13.0–17.0)
MCH: 32.4 pg (ref 26.0–34.0)
MCHC: 33.9 g/dL (ref 30.0–36.0)
MCV: 95.5 fL (ref 80.0–100.0)
Platelets: 302 10*3/uL (ref 150–400)
RBC: 4.48 MIL/uL (ref 4.22–5.81)
RDW: 12.9 % (ref 11.5–15.5)
WBC: 6.9 10*3/uL (ref 4.0–10.5)
nRBC: 0 % (ref 0.0–0.2)

## 2022-06-24 NOTE — Anesthesia Preprocedure Evaluation (Addendum)
Anesthesia Evaluation  Patient identified by MRN, date of birth, ID band Patient awake    Reviewed: Allergy & Precautions, NPO status , Patient's Chart, lab work & pertinent test results  Airway Mallampati: I  TM Distance: >3 FB Neck ROM: Full    Dental no notable dental hx. (+) Teeth Intact, Dental Advisory Given   Pulmonary neg pulmonary ROS   Pulmonary exam normal breath sounds clear to auscultation       Cardiovascular Normal cardiovascular exam Rhythm:Regular Rate:Normal     Neuro/Psych negative neurological ROS     GI/Hepatic negative GI ROS, Neg liver ROS,,,  Endo/Other  Hypothyroidism    Renal/GU      Musculoskeletal   Abdominal   Peds  Hematology Lab Results      Component                Value               Date                      WBC                      6.9                 06/15/2022                HGB                      14.5                06/15/2022                HCT                      42.8                06/15/2022                MCV                      95.5                06/15/2022                PLT                      302                 06/15/2022              Anesthesia Other Findings   Reproductive/Obstetrics                             Anesthesia Physical Anesthesia Plan  ASA: 1  Anesthesia Plan: General   Post-op Pain Management: Toradol IV (intra-op)*, Tylenol PO (pre-op)* and Precedex   Induction: Intravenous  PONV Risk Score and Plan: 3 and Treatment may vary due to age or medical condition, Midazolam and Ondansetron  Airway Management Planned: Oral ETT  Additional Equipment: None  Intra-op Plan:   Post-operative Plan: Extubation in OR  Informed Consent: I have reviewed the patients History and Physical, chart, labs and discussed the procedure including the risks, benefits and alternatives for the proposed anesthesia with the patient or  authorized representative who has indicated his/her understanding and acceptance.  Dental advisory given  Plan Discussed with: CRNA, Anesthesiologist and Surgeon  Anesthesia Plan Comments:         Anesthesia Quick Evaluation

## 2022-06-25 ENCOUNTER — Ambulatory Visit (HOSPITAL_COMMUNITY): Payer: BC Managed Care – PPO | Admitting: Certified Registered"

## 2022-06-25 ENCOUNTER — Encounter (HOSPITAL_COMMUNITY)
Admission: RE | Disposition: A | Discharge status: Home / Self Care | Payer: Self-pay | Source: Ambulatory Visit | Attending: Surgery

## 2022-06-25 ENCOUNTER — Encounter (HOSPITAL_COMMUNITY): Payer: Self-pay | Admitting: Surgery

## 2022-06-25 ENCOUNTER — Other Ambulatory Visit: Payer: Self-pay

## 2022-06-25 ENCOUNTER — Ambulatory Visit (HOSPITAL_COMMUNITY)
Admission: RE | Admit: 2022-06-25 | Discharge: 2022-06-25 | Disposition: A | Discharge status: Home / Self Care | Payer: BC Managed Care – PPO | Source: Ambulatory Visit | Attending: Surgery | Admitting: Surgery

## 2022-06-25 ENCOUNTER — Other Ambulatory Visit (HOSPITAL_COMMUNITY): Payer: Self-pay

## 2022-06-25 DIAGNOSIS — E039 Hypothyroidism, unspecified: Secondary | ICD-10-CM | POA: Insufficient documentation

## 2022-06-25 DIAGNOSIS — D176 Benign lipomatous neoplasm of spermatic cord: Secondary | ICD-10-CM | POA: Diagnosis not present

## 2022-06-25 DIAGNOSIS — E890 Postprocedural hypothyroidism: Secondary | ICD-10-CM | POA: Diagnosis not present

## 2022-06-25 DIAGNOSIS — Z85850 Personal history of malignant neoplasm of thyroid: Secondary | ICD-10-CM | POA: Insufficient documentation

## 2022-06-25 DIAGNOSIS — K4090 Unilateral inguinal hernia, without obstruction or gangrene, not specified as recurrent: Secondary | ICD-10-CM | POA: Insufficient documentation

## 2022-06-25 HISTORY — PX: INGUINAL HERNIA REPAIR: SHX194

## 2022-06-25 SURGERY — REPAIR, HERNIA, INGUINAL, ADULT
Anesthesia: General | Laterality: Right

## 2022-06-25 MED ORDER — MIDAZOLAM HCL 2 MG/2ML IJ SOLN
INTRAMUSCULAR | Status: AC
  Filled 2022-06-25: qty 2

## 2022-06-25 MED ORDER — CEFAZOLIN IN SODIUM CHLORIDE 3-0.9 GM/100ML-% IV SOLN
3.0000 g | INTRAVENOUS | Status: AC
  Administered 2022-06-25: 3 g via INTRAVENOUS
  Filled 2022-06-25: qty 100

## 2022-06-25 MED ORDER — GABAPENTIN 300 MG PO CAPS
300.0000 mg | ORAL_CAPSULE | ORAL | Status: AC
  Administered 2022-06-25: 300 mg via ORAL
  Filled 2022-06-25: qty 1

## 2022-06-25 MED ORDER — FENTANYL CITRATE (PF) 100 MCG/2ML IJ SOLN
INTRAMUSCULAR | Status: DC | PRN
  Administered 2022-06-25: 100 ug via INTRAVENOUS

## 2022-06-25 MED ORDER — HYDROMORPHONE HCL 1 MG/ML IJ SOLN
INTRAMUSCULAR | Status: AC
  Filled 2022-06-25: qty 1

## 2022-06-25 MED ORDER — PROPOFOL 10 MG/ML IV BOLUS
INTRAVENOUS | Status: DC | PRN
  Administered 2022-06-25: 200 mg via INTRAVENOUS
  Administered 2022-06-25: 50 mg via INTRAVENOUS

## 2022-06-25 MED ORDER — MIDAZOLAM HCL 2 MG/2ML IJ SOLN
INTRAMUSCULAR | Status: DC | PRN
  Administered 2022-06-25: 2 mg via INTRAVENOUS

## 2022-06-25 MED ORDER — CHLORHEXIDINE GLUCONATE 4 % EX LIQD: 60.0000 mL | Freq: Once | CUTANEOUS | Status: DC

## 2022-06-25 MED ORDER — KETOROLAC TROMETHAMINE 30 MG/ML IJ SOLN
30.0000 mg | Freq: Once | INTRAMUSCULAR | Status: AC | PRN
  Administered 2022-06-25: 30 mg via INTRAVENOUS

## 2022-06-25 MED ORDER — DEXMEDETOMIDINE HCL IN NACL 80 MCG/20ML IV SOLN
INTRAVENOUS | Status: DC | PRN
  Administered 2022-06-25 (×2): 10 ug via BUCCAL

## 2022-06-25 MED ORDER — SUGAMMADEX SODIUM 200 MG/2ML IV SOLN
INTRAVENOUS | Status: DC | PRN
  Administered 2022-06-25: 250 mg via INTRAVENOUS

## 2022-06-25 MED ORDER — OXYCODONE HCL 5 MG/5ML PO SOLN: 5.0000 mg | Freq: Once | ORAL | Status: AC | PRN

## 2022-06-25 MED ORDER — FENTANYL CITRATE (PF) 100 MCG/2ML IJ SOLN
INTRAMUSCULAR | Status: AC
  Filled 2022-06-25: qty 2

## 2022-06-25 MED ORDER — BUPIVACAINE HCL (PF) 0.25 % IJ SOLN
INTRAMUSCULAR | Status: AC
  Filled 2022-06-25: qty 30

## 2022-06-25 MED ORDER — PROPOFOL 10 MG/ML IV BOLUS
INTRAVENOUS | Status: AC
  Filled 2022-06-25: qty 20

## 2022-06-25 MED ORDER — OXYCODONE HCL 5 MG PO TABS
5.0000 mg | ORAL_TABLET | Freq: Three times a day (TID) | ORAL | 0 refills | Status: AC | PRN
  Filled 2022-06-25: qty 15, 5d supply, fill #0

## 2022-06-25 MED ORDER — 0.9 % SODIUM CHLORIDE (POUR BTL) OPTIME
TOPICAL | Status: DC | PRN
  Administered 2022-06-25: 1000 mL

## 2022-06-25 MED ORDER — ROCURONIUM BROMIDE 10 MG/ML (PF) SYRINGE
PREFILLED_SYRINGE | INTRAVENOUS | Status: AC
  Filled 2022-06-25: qty 10

## 2022-06-25 MED ORDER — OXYCODONE HCL 5 MG PO TABS
ORAL_TABLET | ORAL | Status: AC
  Filled 2022-06-25: qty 1

## 2022-06-25 MED ORDER — DOCUSATE SODIUM 100 MG PO CAPS
100.0000 mg | ORAL_CAPSULE | Freq: Two times a day (BID) | ORAL | 0 refills | Status: AC
  Filled 2022-06-25: qty 100, 50d supply, fill #0

## 2022-06-25 MED ORDER — DEXAMETHASONE SODIUM PHOSPHATE 10 MG/ML IJ SOLN
INTRAMUSCULAR | Status: DC | PRN
  Administered 2022-06-25: 8 mg via INTRAVENOUS

## 2022-06-25 MED ORDER — EPHEDRINE SULFATE-NACL 50-0.9 MG/10ML-% IV SOSY
PREFILLED_SYRINGE | INTRAVENOUS | Status: DC | PRN
  Administered 2022-06-25 (×3): 5 mg via INTRAVENOUS

## 2022-06-25 MED ORDER — BUPIVACAINE LIPOSOME 1.3 % IJ SUSP
INTRAMUSCULAR | Status: AC
  Filled 2022-06-25: qty 20

## 2022-06-25 MED ORDER — ONDANSETRON HCL 4 MG/2ML IJ SOLN: 4.0000 mg | Freq: Once | INTRAMUSCULAR | Status: DC | PRN

## 2022-06-25 MED ORDER — BUPIVACAINE LIPOSOME 1.3 % IJ SUSP
INTRAMUSCULAR | Status: DC | PRN
  Administered 2022-06-25: 50 mL

## 2022-06-25 MED ORDER — ACETAMINOPHEN 500 MG PO TABS
1000.0000 mg | ORAL_TABLET | ORAL | Status: AC
  Administered 2022-06-25: 1000 mg via ORAL
  Filled 2022-06-25: qty 2

## 2022-06-25 MED ORDER — LACTATED RINGERS IV SOLN: INTRAVENOUS | Status: DC

## 2022-06-25 MED ORDER — ONDANSETRON HCL 4 MG/2ML IJ SOLN
INTRAMUSCULAR | Status: DC | PRN
  Administered 2022-06-25: 4 mg via INTRAVENOUS

## 2022-06-25 MED ORDER — LIDOCAINE HCL (PF) 2 % IJ SOLN
INTRAMUSCULAR | Status: AC
  Filled 2022-06-25: qty 5

## 2022-06-25 MED ORDER — LIDOCAINE 2% (20 MG/ML) 5 ML SYRINGE
INTRAMUSCULAR | Status: DC | PRN
  Administered 2022-06-25: 100 mg via INTRAVENOUS

## 2022-06-25 MED ORDER — ORAL CARE MOUTH RINSE: 15.0000 mL | Freq: Once | OROMUCOSAL | Status: AC

## 2022-06-25 MED ORDER — DEXAMETHASONE SODIUM PHOSPHATE 10 MG/ML IJ SOLN
INTRAMUSCULAR | Status: AC
  Filled 2022-06-25: qty 1

## 2022-06-25 MED ORDER — ROCURONIUM BROMIDE 10 MG/ML (PF) SYRINGE
PREFILLED_SYRINGE | INTRAVENOUS | Status: DC | PRN
  Administered 2022-06-25: 70 mg via INTRAVENOUS
  Administered 2022-06-25: 10 mg via INTRAVENOUS

## 2022-06-25 MED ORDER — KETOROLAC TROMETHAMINE 30 MG/ML IJ SOLN
INTRAMUSCULAR | Status: AC
  Filled 2022-06-25: qty 1

## 2022-06-25 MED ORDER — BUPIVACAINE LIPOSOME 1.3 % IJ SUSP: 20.0000 mL | Freq: Once | INTRAMUSCULAR | Status: DC

## 2022-06-25 MED ORDER — DEXMEDETOMIDINE HCL IN NACL 80 MCG/20ML IV SOLN
INTRAVENOUS | Status: AC
  Filled 2022-06-25: qty 20

## 2022-06-25 MED ORDER — ONDANSETRON HCL 4 MG/2ML IJ SOLN
INTRAMUSCULAR | Status: AC
  Filled 2022-06-25: qty 2

## 2022-06-25 MED ORDER — OXYCODONE HCL 5 MG PO TABS
5.0000 mg | ORAL_TABLET | Freq: Once | ORAL | Status: AC | PRN
  Administered 2022-06-25: 5 mg via ORAL

## 2022-06-25 MED ORDER — CHLORHEXIDINE GLUCONATE 0.12 % MT SOLN
15.0000 mL | Freq: Once | OROMUCOSAL | Status: AC
  Administered 2022-06-25: 15 mL via OROMUCOSAL

## 2022-06-25 MED ORDER — HYDROMORPHONE HCL 1 MG/ML IJ SOLN
0.2500 mg | INTRAMUSCULAR | Status: DC | PRN
  Administered 2022-06-25 (×2): 0.25 mg via INTRAVENOUS

## 2022-06-25 SURGICAL SUPPLY — 48 items
APL PRP STRL LF DISP 70% ISPRP (MISCELLANEOUS) ×1
APL SKNCLS STERI-STRIP NONHPOA (GAUZE/BANDAGES/DRESSINGS) ×1
BAG COUNTER SPONGE SURGICOUNT (BAG) IMPLANT
BAG SPNG CNTER NS LX DISP (BAG)
BENZOIN TINCTURE PRP APPL 2/3 (GAUZE/BANDAGES/DRESSINGS) ×1 IMPLANT
BLADE SURG 15 STRL LF DISP TIS (BLADE) ×1 IMPLANT
BLADE SURG 15 STRL SS (BLADE) ×1
CHLORAPREP W/TINT 26 (MISCELLANEOUS) ×1 IMPLANT
COVER SURGICAL LIGHT HANDLE (MISCELLANEOUS) ×1 IMPLANT
DRAIN PENROSE 0.5X18 (DRAIN) ×1 IMPLANT
DRAPE LAPAROSCOPIC ABDOMINAL (DRAPES) ×1 IMPLANT
ELECT REM PT RETURN 15FT ADLT (MISCELLANEOUS) ×1 IMPLANT
GAUZE SPONGE 4X4 12PLY STRL (GAUZE/BANDAGES/DRESSINGS) IMPLANT
GLOVE BIO SURGEON STRL SZ 6 (GLOVE) ×1 IMPLANT
GLOVE INDICATOR 6.5 STRL GRN (GLOVE) ×1 IMPLANT
GLOVE SS BIOGEL STRL SZ 6 (GLOVE) ×1 IMPLANT
GOWN STRL REUS W/ TWL LRG LVL3 (GOWN DISPOSABLE) ×1 IMPLANT
GOWN STRL REUS W/ TWL XL LVL3 (GOWN DISPOSABLE) IMPLANT
GOWN STRL REUS W/TWL LRG LVL3 (GOWN DISPOSABLE) ×1
GOWN STRL REUS W/TWL XL LVL3 (GOWN DISPOSABLE)
KIT BASIN OR (CUSTOM PROCEDURE TRAY) ×1 IMPLANT
KIT TURNOVER KIT A (KITS) IMPLANT
MARKER SKIN DUAL TIP RULER LAB (MISCELLANEOUS) ×1 IMPLANT
MESH ULTRAPRO 3X6 7.6X15CM (Mesh General) IMPLANT
NDL HYPO 22X1.5 SAFETY MO (MISCELLANEOUS) ×1 IMPLANT
NEEDLE HYPO 22X1.5 SAFETY MO (MISCELLANEOUS) ×1 IMPLANT
NEEDLE SAFETY HYPO 22GAX1.5 (MISCELLANEOUS) ×1
PACK BASIC VI WITH GOWN DISP (CUSTOM PROCEDURE TRAY) ×1 IMPLANT
PENCIL SMOKE EVACUATOR (MISCELLANEOUS) IMPLANT
SPIKE FLUID TRANSFER (MISCELLANEOUS) ×1 IMPLANT
SPONGE T-LAP 4X18 ~~LOC~~+RFID (SPONGE) ×1 IMPLANT
STRIP CLOSURE SKIN 1/2X4 (GAUZE/BANDAGES/DRESSINGS) ×1 IMPLANT
SUT ETHIBOND 0 MO6 C/R (SUTURE) ×1 IMPLANT
SUT MNCRL AB 4-0 PS2 18 (SUTURE) ×1 IMPLANT
SUT PDS AB 0 CT1 36 (SUTURE) ×2 IMPLANT
SUT SILK 3 0 (SUTURE) ×1
SUT SILK 3-0 18XBRD TIE 12 (SUTURE) ×1 IMPLANT
SUT VIC AB 3-0 SH 27 (SUTURE) ×2
SUT VIC AB 3-0 SH 27XBRD (SUTURE) ×2 IMPLANT
SUT VICRYL 0 UR6 27IN ABS (SUTURE) IMPLANT
SUT VICRYL 3 0 BR 18  UND (SUTURE) ×1
SUT VICRYL 3 0 BR 18 UND (SUTURE) ×1 IMPLANT
SYR CONTROL 10ML LL (SYRINGE) ×1 IMPLANT
TAPE CLOTH 3X10 WHT NS LF (GAUZE/BANDAGES/DRESSINGS) IMPLANT
TAPE STRIPS DRAPE STRL (GAUZE/BANDAGES/DRESSINGS) IMPLANT
TOWEL OR 17X26 10 PK STRL BLUE (TOWEL DISPOSABLE) ×1 IMPLANT
TOWEL OR NON WOVEN STRL DISP B (DISPOSABLE) ×1 IMPLANT
TRAY FOLEY MTR SLVR 16FR STAT (SET/KITS/TRAYS/PACK) IMPLANT

## 2022-06-25 NOTE — Anesthesia Procedure Notes (Signed)
Procedure Name: Intubation Date/Time: 06/25/2022 7:31 AM  Performed by: Eben Burow, CRNAPre-anesthesia Checklist: Patient identified, Emergency Drugs available, Suction available, Patient being monitored and Timeout performed Patient Re-evaluated:Patient Re-evaluated prior to induction Oxygen Delivery Method: Circle system utilized Preoxygenation: Pre-oxygenation with 100% oxygen Induction Type: IV induction Ventilation: Mask ventilation without difficulty and Oral airway inserted - appropriate to patient size Laryngoscope Size: Mac and 4 Grade View: Grade I Tube type: Oral Tube size: 8.0 mm Number of attempts: 1 Airway Equipment and Method: Stylet Placement Confirmation: ETT inserted through vocal cords under direct vision, positive ETCO2 and breath sounds checked- equal and bilateral Secured at: 24 cm Tube secured with: Tape Dental Injury: Teeth and Oropharynx as per pre-operative assessment

## 2022-06-25 NOTE — Anesthesia Postprocedure Evaluation (Signed)
Anesthesia Post Note  [name]  Procedure(s) Performed: OPEN RIGHT INGUINAL HERNIA REPAIR (Right)     Patient location during evaluation: PACU Anesthesia Type: General Level of consciousness: awake and alert Pain management: pain level controlled Vital Signs Assessment: post-procedure vital signs reviewed and stable Respiratory status: spontaneous breathing, nonlabored ventilation, respiratory function stable and patient connected to nasal cannula oxygen Cardiovascular status: blood pressure returned to baseline and stable Postop Assessment: no apparent nausea or vomiting Anesthetic complications: no  No notable events documented.  Last Vitals:  Vitals:   06/25/22 0945 06/25/22 1000  BP: 125/74 131/79  Pulse: 66 64  Resp: 17 15  Temp: 36.6 C 36.7 C  SpO2: 94% 97%    Last Pain:  Vitals:   06/25/22 1000  TempSrc:   PainSc: 2                  Barnet Glasgow

## 2022-06-25 NOTE — Discharge Instructions (Signed)
HERNIA REPAIR: POST OP INSTRUCTIONS   EAT Gradually transition to a high fiber diet with a fiber supplement over the next few weeks after discharge.  Start with a pureed / full liquid diet (see below)  WALK Walk an hour a day (cumulative- not all at once).  Control your pain to do that.    CONTROL PAIN Control pain so that you can walk, sleep, tolerate sneezing/coughing, and go up/down stairs.  HAVE A BOWEL MOVEMENT DAILY Keep your bowels regular to avoid problems.  OK to try a laxative to override constipation.  OK to use an antidiarrheal to slow down diarrhea.  Call if not better after 2 tries  CALL IF YOU HAVE PROBLEMS/CONCERNS Call if you are still struggling despite following these instructions. Call if you have concerns not answered by these instructions  ######################################################################    DIET: Follow a light bland diet & liquids the first 24 hours after arrival home, such as soup, liquids, starches, etc.  Be sure to drink plenty of fluids.  Quickly advance to a usual solid diet within a few days.  Avoid fast food or heavy meals initially as you are more likely to get nauseated or have irregular bowels.    Take your usually prescribed home medications unless otherwise directed.  PAIN CONTROL: Pain is best controlled by a usual combination of three different methods TOGETHER: Ice/Heat Over the counter pain medication Prescription pain medication Most patients will experience some swelling and bruising around the hernia(s) such as the bellybutton, groins, or old incisions.  Ice packs or heating pads (30-60 minutes up to 6 times a day) will help. Use ice for the first few days to help decrease swelling and bruising, then switch to heat to help relax tight/sore spots and speed recovery.  Some people prefer to use ice alone, heat alone, alternating between ice & heat.  Experiment to what works for you.  Swelling and bruising can take several  weeks to resolve.   It is helpful to take an over-the-counter pain medication regularly for the first days: Naproxen (Aleve, etc)  Two '220mg'$  tabs twice a day OR Ibuprofen (Advil, etc) Three '200mg'$  tabs four times a day (every meal & bedtime) AND Acetaminophen (Tylenol, etc) 325-'650mg'$  four times a day (every meal & bedtime) A  prescription for pain medication should be given to you upon discharge.  Take your pain medication as prescribed, IF NEEDED.  If you are having problems/concerns with the prescription medicine (does not control pain, nausea, vomiting, rash, itching, etc), please call us [phone] to see if we need to switch you to a different pain medicine that will work better for you and/or control your side effect better. If you need a refill on your pain medication, please contact your pharmacy.  They will contact our office to request authorization. Prescriptions will not be filled after 5 pm or on week-ends.  Avoid getting constipated.  Between the surgery and the pain medications, it is common to experience some constipation.  Increasing fluid intake and taking a fiber supplement (such as Metamucil, Citrucel, FiberCon, MiraLax, etc) 1-2 times a day regularly will usually help prevent this problem from occurring.  A mild laxative (prune juice, Milk of Magnesia, MiraLax, etc) should be taken according to package directions if there are no bowel movements after 48 hours.    Wash / shower every day, starting 2 days after surgery.  You may shower over the steri strips or skin glue which are waterproof.  No rubbing,  scrubbing, lotions or ointments to incision(s). Do not soak or submerge.   Remove your outer bandage 2 days after surgery. Steri strips will peel off after 1-2 weeks.   You may leave the incision open to air.  You may replace a dressing/Band-Aid to cover an incision for comfort if you wish.  Continue to shower over incision(s) after the dressing is off.  ACTIVITIES as tolerated:    You may resume regular (light) daily activities beginning the next day--such as daily self-care, walking, climbing stairs--gradually increasing activities as tolerated.  Control your pain so that you can walk an hour a day.  If you can walk 30 minutes without difficulty, it is safe to try more intense activity such as jogging, treadmill, bicycling, low-impact aerobics, swimming, etc. Refrain from the most intensive and strenuous activity such as sit-ups, heavy lifting, contact sports, etc  Refrain from any heavy lifting or straining until 6 weeks after surgery.   DO NOT PUSH THROUGH PAIN.  Let pain be your guide: If it hurts to do something, don't do it.  Pain is your body warning you to avoid that activity for another week until the pain goes down. You may drive when you are no longer taking prescription pain medication, you can comfortably wear a seatbelt, and you can safely maneuver your car and apply brakes. You may have sexual intercourse when it is comfortable.   FOLLOW UP in our office Please call [facility] at [phone] to set up an appointment to see your surgeon in the office for a follow-up appointment approximately 2-3 weeks after your surgery. Make sure that you call for this appointment the day you arrive home to insure a convenient appointment time.  9.  If you have disability of FMLA / Family leave forms, please bring the forms to the office for processing.  (do not give to your surgeon).  WHEN TO CALL us [phone]: Poor pain control Reactions / problems with new medications (rash/itching, nausea, etc)  Fever over 101.5 F (38.5 C) Inability to urinate Nausea and/or vomiting Worsening swelling or bruising Continued bleeding from incision. Increased pain, redness, or drainage from the incision   The clinic staff is available to answer your questions during regular business hours (8:30am-5pm).  Please don't hesitate to call and ask to speak to one of our nurses for clinical  concerns.   If you have a medical emergency, go to the nearest emergency room or call 911.  A surgeon from [facility] is always on call at the hospitals in [facility], [place], [place], [place], [place]  [address] ?  [address] [address], [place], [place]   [address] MAIN: [phone] ? TOLL FREE: [phone] ? FAX: [phone] [email or web address]

## 2022-06-25 NOTE — Op Note (Signed)
Operative Note  Ricardo House  [id number]  [id number]  06/25/2022   Surgeon: Clovis Riley MD FACS   Procedure performed: Open right inguinal hernia repair with mesh   Preop diagnosis:  right inguinal hernia   Post-op diagnosis/intraop findings:  indirect inguinal hernia, moderate cord lipoma   Specimens: none   EBL: 5cc   Complications: none   Description of procedure: After confirming informed consent, the patient was taken to the operating room and placed supine on operating room table where general anesthesia was initiated, preoperative antibiotics were administered, SCDs applied, and a formal timeout was performed. The groin was clipped, prepped and draped in the usual sterile fashion. An oblique incision was made the just above the inguinal ligament after infiltrating the tissues with local anesthetic (exparel mixed with 0.25% marcaine). Soft tissues were dissected using electrocautery until the external oblique aponeurosis was encountered. This was divided sharply to expand the external ring. A plane was bluntly developed between the spermatic cord and the external oblique. The spermatic cord was then bluntly dissected away from the pubic tubercle and encircled with a Penrose. Inspection of the inguinal anatomy revealed a moderate cord lipoma and a moderate indirect sac. The indirect hernia sac was bluntly dissected away from the cord structures and skeletonized to the level of the internal ring, where it was reduced intact into the abdomen. The cord lipoma was skeletonized to the level of the internal ring, where it was ampuated and the pedicle ligated with a 3-0 vicryl tie. The inguinal floor was reconstructed suturing the conjoint tendon to the inguinal ligament with interrupted 0 PDS, leaving an internal ring just sufficient for the cord structures. A 3 x 6 piece of ultra Pro mesh was brought onto the field and trimmed to approximate the field. This was sutured to the pubic tubercle  fascia, inferior shelving edge and to the internal oblique superiorly with interrupted 0 ethibonds. The tails of the mesh were wrapped around the spermatic cord, ensuring adequate room for the cord, and sutured to each other with 0 ethibond, and then directed laterally to lie flat beneath the external oblique aponeurosis. The apex of the slit in the mesh medially was reinforced with an additional 0 ethibond. Hemostasis was ensured within the wound. The Penrose was removed. The external oblique aponeurosis was reapproximated with a running 3-0 Vicryl to re-create a narrowed external ring. More local was infiltrated around the pubic tubercle and in the plane just below the external oblique. The Scarpa's was reapproximated with interrupted 3-0 Vicryls. The skin was closed with a running subcuticular 4-0 Monocryl. The remainder of the local was injected in the subcutaneous and subcuticular space. The field was then cleaned, benzoin and Steri-Strips and sterile bandage were applied. The patient was then awakened extubated and taken to PACU in stable condition.    All counts were correct at the completion of the case

## 2022-06-25 NOTE — H&P (Signed)
Ricardo House [id number]    Referring Provider:  Collene Leyden, MD     Subjective    Chief Complaint: New Consultation and Inguinal Hernia       History of Present Illness:    Very pleasant 52 year old male with history of thyroid cancer that is post thyroidectomy in 2021 by Dr. Harlow Asa who presents for evaluation of a possible right inguinal hernia.  About 2 months ago he developed a sensation of pressure in the right groin and focal discomfort, as well as a visible protrusion in the area.  Notes that discomfort radiates into the scrotum but is usually more in the groin area.  Denies any obstructive or urinary symptoms.  Symptoms have not really changed much, he states the last 3 to 4 days he has not had much in the way of pain.   He works in [employer], no significant heavy lifting or straining in his job.  He does have a ski trip coming up at the beginning of February.     Review of Systems: A complete review of systems was obtained from the patient.  I have reviewed this information and discussed as appropriate with the patient.  See HPI as well for other ROS.     Medical History:     Past Medical History:  Diagnosis Date   Thyroid disease        There is no problem list on file for this patient.          Past Surgical History:  Procedure Laterality Date   knee surgerey       thyroid surgery          No Known Allergies         Current Outpatient Medications on File Prior to Visit  Medication Sig Dispense Refill   levothyroxine (SYNTHROID) 150 MCG tablet TAKE 1 TABLET BY MOUTH EVERY DAY IN THE MORNING ON AN EMPTY STOMACH       loratadine (CLARITIN) 10 mg tablet 1 tablet Orally Once a day as needed        No current facility-administered medications on file prior to visit.           Family History  Problem Relation Age of Onset   High blood pressure (Hypertension) Mother        Social History       Tobacco Use  Smoking Status Never  Smokeless Tobacco Never       Social History         Socioeconomic History   Marital status: Married  Tobacco Use   Smoking status: Never   Smokeless tobacco: Never  Substance and Sexual Activity   Alcohol use: Yes      Comment: social   Drug use: Never      Objective:         Vitals:    05/14/22 1147  BP: 131/88  Pulse: 78  Temp: 36.7 C (98 F)  SpO2: 98%  Weight: (!) 122.5 kg (270 lb)  Height: 195.6 cm ('6\' 5"'$ )    Body mass index is 32.02 kg/m.   Gen: A&Ox3, no distress  Unlabored respiration Abdomen soft, nontender, nondistended.  Tender partially reducible right inguinal hernia without overlying skin change.  No left inguinal hernia.   Assessment and Plan:  Diagnoses and all orders for this visit:   Non-recurrent unilateral inguinal hernia without obstruction or gangrene   We discussed the relevant anatomy and options for repair including open and minimally invasive.  I recommend  an open repair and we went over the surgery and discussed risks of bleeding, infection, pain, scarring, injury to structures in the area including nerves, blood vessels, bowel, bladder, risk of chronic pain, hernia recurrence, risk of seroma or hematoma, urinary retention, and risks of general anesthesia including cardiovascular, pulmonary, and thromboembolic complications.  Questions were answered.  Patient wishes to proceed with scheduling.  I did recommend that he postpone surgery until after his ski trip, because he will not be able to ski so soon after hernia surgery.  We discussed signs and symptoms of incarceration/strangulation to monitor for and what would merit presenting for emergency evaluation.       Taffie Eckmann Raquel James, MD

## 2022-06-25 NOTE — Transfer of Care (Signed)
Immediate Anesthesia Transfer of Care Note  [name]  Procedure(s) Performed: OPEN RIGHT INGUINAL HERNIA REPAIR (Right)  Patient Location: PACU  Anesthesia Type:General  Level of Consciousness: awake, alert , and patient cooperative  Airway & Oxygen Therapy: Patient Spontanous Breathing and Patient connected to face mask oxygen  Post-op Assessment: Report given to RN and Post -op Vital signs reviewed and stable  Post vital signs: Reviewed and stable  Last Vitals:  Vitals Value Taken Time  BP 138/83 06/25/22 0855  Temp 36.6 C 06/25/22 0855  Pulse 73 06/25/22 0855  Resp 20 06/25/22 0855  SpO2 100 % 06/25/22 0855  Vitals shown include unvalidated device data.  Last Pain:  Vitals:   06/25/22 0537  TempSrc: Oral         Complications: No notable events documented.

## 2022-06-27 ENCOUNTER — Encounter (HOSPITAL_COMMUNITY): Payer: Self-pay | Admitting: Surgery

## 2022-08-02 ENCOUNTER — Other Ambulatory Visit (HOSPITAL_BASED_OUTPATIENT_CLINIC_OR_DEPARTMENT_OTHER): Payer: Self-pay

## 2022-08-02 MED ORDER — LEVOTHYROXINE SODIUM 150 MCG PO TABS
150.0000 ug | ORAL_TABLET | Freq: Every morning | ORAL | 3 refills | Status: AC
  Filled 2022-08-02: qty 90, 90d supply, fill #0

## 2022-08-02 MED ORDER — LEVOTHYROXINE SODIUM 150 MCG PO TABS
150.0000 ug | ORAL_TABLET | Freq: Every morning | ORAL | 3 refills | Status: AC
  Filled 2022-08-02 – 2023-04-05 (×2): qty 90, 90d supply, fill #0

## 2022-08-07 ENCOUNTER — Other Ambulatory Visit (HOSPITAL_BASED_OUTPATIENT_CLINIC_OR_DEPARTMENT_OTHER): Payer: Self-pay

## 2022-09-09 ENCOUNTER — Ambulatory Visit: Payer: BC Managed Care – PPO | Admitting: Orthopedic Surgery

## 2022-09-14 DIAGNOSIS — E890 Postprocedural hypothyroidism: Secondary | ICD-10-CM | POA: Diagnosis not present

## 2022-09-15 ENCOUNTER — Encounter: Payer: Self-pay | Admitting: Orthopedic Surgery

## 2022-09-15 ENCOUNTER — Other Ambulatory Visit (INDEPENDENT_AMBULATORY_CARE_PROVIDER_SITE_OTHER): Payer: BC Managed Care – PPO

## 2022-09-15 ENCOUNTER — Ambulatory Visit (INDEPENDENT_AMBULATORY_CARE_PROVIDER_SITE_OTHER): Payer: BC Managed Care – PPO | Admitting: Orthopedic Surgery

## 2022-09-15 ENCOUNTER — Telehealth: Payer: Self-pay

## 2022-09-15 DIAGNOSIS — G8929 Other chronic pain: Secondary | ICD-10-CM | POA: Diagnosis not present

## 2022-09-15 DIAGNOSIS — M25561 Pain in right knee: Secondary | ICD-10-CM | POA: Diagnosis not present

## 2022-09-15 DIAGNOSIS — M1712 Unilateral primary osteoarthritis, left knee: Secondary | ICD-10-CM

## 2022-09-15 DIAGNOSIS — M170 Bilateral primary osteoarthritis of knee: Secondary | ICD-10-CM | POA: Diagnosis not present

## 2022-09-15 NOTE — Progress Notes (Unsigned)
Office Visit Note   [name]           Date of Birth: [birth year]           MRN: [id number] Visit Date: 09/15/2022 Requested by: Irven Coe, MD [id number] [place],  [place] [address] PCP: Irven Coe, MD  Subjective: Chief Complaint  Patient presents with   Right Knee - Pain    HPI: Ricardo House is a 52 y.o. male who presents to the office reporting bilateral knee pain.  Patient is 52 years old.  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  That has allowed him to become less symptomatic.  He has changed his workout routine to do the elliptical but not every day.  He does get pain on a fairly regular basis but he is able to manage it currently.  Has not had an injection in about a year.  He also has been losing some weight which has helped his knee pain.  Denies much in the way of mechanical symptoms.  Does not take any pain medication..                ROS: All systems reviewed are negative as they relate to the chief complaint within the history of present illness.  Patient denies fevers or chills.  Assessment & Plan: Visit Diagnoses:  1. Chronic pain of right knee     Plan: Impression is bilateral knee pain with varus alignment in both knees.  Medial compartment arthritis predominates but he does also have some patellofemoral arthritis.  Plan at this time is to aspirate and inject both knees.  He is not really in the market for knee replacement.  I think gel injection would also be a good consideration for [place].  We will inject the gel once these cortisone shots wear out.  Follow-Up Instructions: No follow-ups on file.   Orders:  Orders Placed This Encounter  Procedures   XR KNEE 3 VIEW RIGHT   No orders of the defined types were placed in this encounter.     Procedures: Large Joint Inj: bilateral knee on 09/15/2022 10:14 PM Indications: diagnostic evaluation, joint swelling and pain Details: 18 G 1.5 in needle,  superolateral approach  Arthrogram: No  Medications (Right): 5 mL lidocaine 1 %; 4 mL bupivacaine 0.25 %; 40 mg methylPREDNISolone acetate 40 MG/ML Medications (Left): 5 mL lidocaine 1 %; 4 mL bupivacaine 0.25 %; 40 mg methylPREDNISolone acetate 40 MG/ML Outcome: tolerated well, no immediate complications Procedure, treatment alternatives, risks and benefits explained, specific risks discussed. Consent was given by the patient. Immediately prior to procedure a time out was called to verify the correct patient, procedure, equipment, support staff and site/side marked as required. Patient was prepped and draped in the usual sterile fashion.    This patient is diagnosed with osteoarthritis of the knee(s).    Radiographs show evidence of joint space narrowing, osteophytes, subchondral sclerosis and/or subchondral cysts.  This patient has knee pain which interferes with functional and activities of daily living.    This patient has experienced inadequate response, adverse effects and/or intolerance with conservative treatments such as acetaminophen, NSAIDS, topical creams, physical therapy or regular exercise, knee bracing and/or weight loss.   This patient has experienced inadequate response or has a contraindication to intra articular steroid injections for at least 3 months.   This patient is not scheduled to have a total knee replacement within 6 months of starting treatment with  viscosupplementation. We do as a professional   Clinical Data: No additional findings.  Objective: Vital Signs: There were no vitals taken for this visit.  Physical Exam:  Constitutional: Patient appears well-developed HEENT:  Head: Normocephalic Eyes:EOM are normal Neck: Normal range of motion Cardiovascular: Normal rate Pulmonary/chest: Effort normal Neurologic: Patient is alert Skin: Skin is warm Psychiatric: Patient has normal mood and affect  Ortho Exam: Ortho exam demonstrates range of motion in  both knees of around 5 to 7 degrees to 115 of flexion.  Collateral crucial ligaments are stable.  Mild effusion right knee trace effusion left knee.  Patellofemoral crepitus is present more on the right than the left.  Extensor mechanism intact.  Pedal pulses palpable.  No groin pain with internal or external rotation of either leg.  Specialty Comments:  No specialty comments available.  Imaging: XR KNEE 3 VIEW RIGHT  Result Date: 09/15/2022 AP lateral merchant radiographs right knee reviewed.  Mild varus alignment is present.  Tricompartmental arthritis is noted worse in the medial and patellofemoral compartments.  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 No pertinent family history.  Past Surgical History:  Procedure Laterality Date   INGUINAL HERNIA REPAIR Right 06/25/2022   Procedure: OPEN RIGHT INGUINAL HERNIA REPAIR;  Surgeon: Berna Bue, MD;  Location: [facility];  Service: General;  Laterality: Right;   KNEE SURGERY Bilateral    THYROIDECTOMY N/A 01/02/2020   Procedure: TOTAL THYROIDECTOMY WITH LIMITED LYMPH NODE DISSECTION;  Surgeon: Darnell Level, MD;  Location: [facility];  Service: General;  Laterality: N/A;   VASECTOMY     WISDOM TOOTH EXTRACTION     Social History   Occupational History   Not on file  Tobacco Use   Smoking status: Never   Smokeless tobacco: Never  Vaping Use   Vaping Use: Never used  Substance and Sexual Activity   Alcohol use: Yes    Alcohol/week: 9.0 standard drinks of alcohol    Types: 9 Cans of beer per week    Comment: 3 days a week   Drug use: Never   Sexual activity: Not on file

## 2022-09-15 NOTE — Telephone Encounter (Signed)
Can we get patient approved for bil gel inj  

## 2022-09-16 MED ORDER — BUPIVACAINE HCL 0.25 % IJ SOLN
4.0000 mL | INTRAMUSCULAR | Status: AC | PRN
  Administered 2022-09-15: 4 mL via INTRA_ARTICULAR

## 2022-09-16 MED ORDER — METHYLPREDNISOLONE ACETATE 40 MG/ML IJ SUSP
40.0000 mg | INTRAMUSCULAR | Status: AC | PRN
Start: 2022-09-15 — End: 2022-09-15
  Administered 2022-09-15: 40 mg via INTRA_ARTICULAR

## 2022-09-16 MED ORDER — METHYLPREDNISOLONE ACETATE 40 MG/ML IJ SUSP
40.0000 mg | INTRAMUSCULAR | Status: AC | PRN
  Administered 2022-09-15: 40 mg via INTRA_ARTICULAR

## 2022-09-16 MED ORDER — LIDOCAINE HCL 1 % IJ SOLN
5.0000 mL | INTRAMUSCULAR | Status: AC | PRN
  Administered 2022-09-15: 5 mL

## 2022-09-17 NOTE — Telephone Encounter (Signed)
VOB submitted for Orthovisc, bilateral knee 

## 2022-09-22 ENCOUNTER — Other Ambulatory Visit (HOSPITAL_BASED_OUTPATIENT_CLINIC_OR_DEPARTMENT_OTHER): Payer: Self-pay

## 2022-09-22 MED ORDER — LEVOTHYROXINE SODIUM 175 MCG PO TABS
175.0000 ug | ORAL_TABLET | Freq: Every day | ORAL | 11 refills | Status: DC
  Filled 2022-09-22: qty 30, 30d supply, fill #0
  Filled 2022-11-01: qty 30, 30d supply, fill #1
  Filled 2022-12-03: qty 30, 30d supply, fill #2
  Filled 2022-12-29: qty 30, 30d supply, fill #3
  Filled 2023-02-01: qty 30, 30d supply, fill #4
  Filled 2023-03-01: qty 30, 30d supply, fill #5
  Filled 2023-04-05: qty 30, 30d supply, fill #6
  Filled 2023-05-02: qty 30, 30d supply, fill #7
  Filled 2023-06-03: qty 30, 30d supply, fill #8
  Filled 2023-07-07 – 2023-07-08 (×2): qty 30, 30d supply, fill #9

## 2022-10-06 ENCOUNTER — Telehealth: Payer: Self-pay

## 2022-10-06 NOTE — Telephone Encounter (Signed)
PA submitted online through covermymeds for Orthovisc, bilateral knee. Pending key: [id number]

## 2022-11-01 ENCOUNTER — Other Ambulatory Visit (HOSPITAL_BASED_OUTPATIENT_CLINIC_OR_DEPARTMENT_OTHER): Payer: Self-pay

## 2022-11-16 DIAGNOSIS — Z85850 Personal history of malignant neoplasm of thyroid: Secondary | ICD-10-CM | POA: Diagnosis not present

## 2022-11-16 DIAGNOSIS — Z9889 Other specified postprocedural states: Secondary | ICD-10-CM | POA: Diagnosis not present

## 2022-11-16 DIAGNOSIS — E890 Postprocedural hypothyroidism: Secondary | ICD-10-CM | POA: Diagnosis not present

## 2022-12-03 ENCOUNTER — Other Ambulatory Visit (HOSPITAL_BASED_OUTPATIENT_CLINIC_OR_DEPARTMENT_OTHER): Payer: Self-pay

## 2022-12-29 ENCOUNTER — Other Ambulatory Visit (HOSPITAL_BASED_OUTPATIENT_CLINIC_OR_DEPARTMENT_OTHER): Payer: Self-pay

## 2023-01-19 ENCOUNTER — Ambulatory Visit (INDEPENDENT_AMBULATORY_CARE_PROVIDER_SITE_OTHER): Payer: BC Managed Care – PPO | Admitting: Orthopedic Surgery

## 2023-01-19 DIAGNOSIS — M170 Bilateral primary osteoarthritis of knee: Secondary | ICD-10-CM | POA: Diagnosis not present

## 2023-01-20 ENCOUNTER — Encounter: Payer: Self-pay | Admitting: Orthopedic Surgery

## 2023-01-20 MED ORDER — LIDOCAINE HCL 1 % IJ SOLN
5.0000 mL | INTRAMUSCULAR | Status: AC | PRN
Start: 2023-01-19 — End: 2023-01-19
  Administered 2023-01-19: 5 mL

## 2023-01-20 MED ORDER — BUPIVACAINE HCL 0.25 % IJ SOLN
4.0000 mL | INTRAMUSCULAR | Status: AC | PRN
Start: 2023-01-19 — End: 2023-01-19
  Administered 2023-01-19: 4 mL via INTRA_ARTICULAR

## 2023-01-20 MED ORDER — BUPIVACAINE HCL 0.25 % IJ SOLN
4.0000 mL | INTRAMUSCULAR | Status: AC | PRN
  Administered 2023-01-19: 4 mL via INTRA_ARTICULAR

## 2023-01-20 MED ORDER — METHYLPREDNISOLONE ACETATE 40 MG/ML IJ SUSP
40.0000 mg | INTRAMUSCULAR | Status: AC | PRN
Start: 2023-01-19 — End: 2023-01-19
  Administered 2023-01-19: 40 mg via INTRA_ARTICULAR

## 2023-01-20 NOTE — Progress Notes (Signed)
Office Visit Note   [name]           Date of Birth: Jun 22, 1970           MRN: [id number] Visit Date: 01/19/2023 Requested by: Irven Coe, MD [address] [place],  [place] [address] PCP: Irven Coe, MD  Subjective: Chief Complaint  Patient presents with   Right Knee - Pain    HPI: Ricardo House is a 52 y.o. male who presents to the office reporting bilateral knee pain right worse than left.  He has had what he describes as "a bad 3 weeks".  Was having to do a lot of walking and standing including going down hills.  Did a 2 mile walk as well.  Did some hiking.  Does report bilateral knee pain but the effusion in the right knee is starting to limit his motion.  He does have known varus arthritis in both knees.  Injections have helped him in the past..                ROS: All systems reviewed are negative as they relate to the chief complaint within the history of present illness.  Patient denies fevers or chills.  Assessment & Plan: Visit Diagnoses:  1. Primary osteoarthritis of both knees     Plan: Impression is bilateral knee pain with exacerbation of existing arthritis.  Plan is bilateral knee injections today with aspiration of the right knee.  Will see how this does for him.  Hopefully it can reset to the point where he can continue without as much pain.  He is heading for knee replacement at sometime in the future but has been managing quite well with episodic injections activity modification and nonload bearing quad strengthening exercises.  Follow-Up Instructions: No follow-ups on file.   Orders:  No orders of the defined types were placed in this encounter.  No orders of the defined types were placed in this encounter.     Procedures: Large Joint Inj: bilateral knee on 01/19/2023 5:11 PM Indications: diagnostic evaluation, joint swelling and pain Details: 18 G 1.5 in needle, superolateral approach  Arthrogram: No  Medications (Right): 5 mL  lidocaine 1 %; 4 mL bupivacaine 0.25 %; 40 mg methylPREDNISolone acetate 40 MG/ML Medications (Left): 5 mL lidocaine 1 %; 4 mL bupivacaine 0.25 %; 40 mg methylPREDNISolone acetate 40 MG/ML Outcome: tolerated well, no immediate complications Procedure, treatment alternatives, risks and benefits explained, specific risks discussed. Consent was given by the patient. Immediately prior to procedure a time out was called to verify the correct patient, procedure, equipment, support staff and site/side marked as required. Patient was prepped and draped in the usual sterile fashion.       Clinical Data: No additional findings.  Objective: Vital Signs: There were no vitals taken for this visit.  Physical Exam:  Constitutional: Patient appears well-developed HEENT:  Head: Normocephalic Eyes:EOM are normal Neck: Normal range of motion Cardiovascular: Normal rate Pulmonary/chest: Effort normal Neurologic: Patient is alert Skin: Skin is warm Psychiatric: Patient has normal mood and affect  Ortho Exam: Ortho exam demonstrates mild effusion right knee no effusion left knee.  Collaterals are stable.  Varus alignment noted.  About a 5 degree flexion contracture in both knees.  Pedal pulses palpable.  Bends to around 105 degrees.  No groin pain with internal/external rotation of either leg.  Specialty Comments:  No specialty comments available.  Imaging: No results found.   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 No pertinent family history.  Past Surgical History:  Procedure Laterality Date   INGUINAL HERNIA REPAIR Right 06/25/2022   Procedure: OPEN RIGHT INGUINAL HERNIA REPAIR;  Surgeon: Berna Bue, MD;  Location: [facility];  Service: General;  Laterality: Right;   KNEE SURGERY Bilateral    THYROIDECTOMY N/A 01/02/2020    Procedure: TOTAL THYROIDECTOMY WITH LIMITED LYMPH NODE DISSECTION;  Surgeon: Darnell Level, MD;  Location: [facility];  Service: General;  Laterality: N/A;   VASECTOMY     WISDOM TOOTH EXTRACTION     Social History   Occupational History   Not on file  Tobacco Use   Smoking status: Never   Smokeless tobacco: Never  Vaping Use   Vaping status: Never Used  Substance and Sexual Activity   Alcohol use: Yes    Alcohol/week: 9.0 standard drinks of alcohol    Types: 9 Cans of beer per week    Comment: 3 days a week   Drug use: Never   Sexual activity: Not on file

## 2023-02-01 ENCOUNTER — Other Ambulatory Visit (HOSPITAL_BASED_OUTPATIENT_CLINIC_OR_DEPARTMENT_OTHER): Payer: Self-pay

## 2023-02-01 MED ORDER — CYCLOBENZAPRINE HCL 5 MG PO TABS
5.0000 mg | ORAL_TABLET | Freq: Three times a day (TID) | ORAL | 0 refills | Status: DC
  Filled 2023-02-01: qty 20, 7d supply, fill #0

## 2023-02-15 DIAGNOSIS — E890 Postprocedural hypothyroidism: Secondary | ICD-10-CM | POA: Diagnosis not present

## 2023-02-15 DIAGNOSIS — Z13220 Encounter for screening for lipoid disorders: Secondary | ICD-10-CM | POA: Diagnosis not present

## 2023-02-15 DIAGNOSIS — Z130 Encounter for screening for diseases of the blood and blood-forming organs and certain disorders involving the immune mechanism: Secondary | ICD-10-CM | POA: Diagnosis not present

## 2023-02-15 DIAGNOSIS — Z0000 Encounter for general adult medical examination without abnormal findings: Secondary | ICD-10-CM | POA: Diagnosis not present

## 2023-02-15 DIAGNOSIS — Z1159 Encounter for screening for other viral diseases: Secondary | ICD-10-CM | POA: Diagnosis not present

## 2023-02-15 DIAGNOSIS — Z23 Encounter for immunization: Secondary | ICD-10-CM | POA: Diagnosis not present

## 2023-02-25 DIAGNOSIS — Z23 Encounter for immunization: Secondary | ICD-10-CM | POA: Diagnosis not present

## 2023-03-01 ENCOUNTER — Other Ambulatory Visit (HOSPITAL_BASED_OUTPATIENT_CLINIC_OR_DEPARTMENT_OTHER): Payer: Self-pay

## 2023-03-16 ENCOUNTER — Ambulatory Visit: Payer: BC Managed Care – PPO | Admitting: Surgical

## 2023-04-05 ENCOUNTER — Other Ambulatory Visit (HOSPITAL_BASED_OUTPATIENT_CLINIC_OR_DEPARTMENT_OTHER): Payer: Self-pay

## 2023-04-18 DIAGNOSIS — Z1211 Encounter for screening for malignant neoplasm of colon: Secondary | ICD-10-CM | POA: Diagnosis not present

## 2023-05-02 ENCOUNTER — Other Ambulatory Visit: Payer: Self-pay

## 2023-05-09 ENCOUNTER — Encounter: Payer: Self-pay | Admitting: Surgical

## 2023-05-09 ENCOUNTER — Telehealth: Payer: Self-pay | Admitting: Radiology

## 2023-05-09 ENCOUNTER — Ambulatory Visit (INDEPENDENT_AMBULATORY_CARE_PROVIDER_SITE_OTHER): Payer: BC Managed Care – PPO | Admitting: Surgical

## 2023-05-09 VITALS — Ht 77.75 in | Wt 260.0 lb

## 2023-05-09 DIAGNOSIS — M1712 Unilateral primary osteoarthritis, left knee: Secondary | ICD-10-CM | POA: Diagnosis not present

## 2023-05-09 DIAGNOSIS — M1711 Unilateral primary osteoarthritis, right knee: Secondary | ICD-10-CM | POA: Diagnosis not present

## 2023-05-09 DIAGNOSIS — M1710 Unilateral primary osteoarthritis, unspecified knee: Secondary | ICD-10-CM

## 2023-05-09 DIAGNOSIS — M170 Bilateral primary osteoarthritis of knee: Secondary | ICD-10-CM

## 2023-05-09 MED ORDER — LIDOCAINE HCL 1 % IJ SOLN
5.0000 mL | INTRAMUSCULAR | Status: AC | PRN
  Administered 2023-05-09: 5 mL

## 2023-05-09 MED ORDER — BUPIVACAINE HCL 0.25 % IJ SOLN
4.0000 mL | INTRAMUSCULAR | Status: AC | PRN
  Administered 2023-05-09: 4 mL via INTRA_ARTICULAR

## 2023-05-09 MED ORDER — METHYLPREDNISOLONE ACETATE 40 MG/ML IJ SUSP
40.0000 mg | INTRAMUSCULAR | Status: AC | PRN
  Administered 2023-05-09: 40 mg via INTRA_ARTICULAR

## 2023-05-09 NOTE — Progress Notes (Signed)
 Office Visit Note   [name]           Date of Birth: 1970-10-26           MRN: [id number] Visit Date: 05/09/2023 Requested by: Leonel Cole, MD [address] [place],  [place] [address] PCP: Leonel Cole, MD  Subjective: Chief Complaint  Patient presents with   Right Knee - Pain    HPI: Ricardo House is a 53 y.o. male who presents to the office reporting knee pain.  Has history of knee arthritis.  No new falls or injuries.  No fevers or chills.  Previous injections have provided good relief and they are here today to repeat injections.  He had prior injection with Dr. Addie several months ago.  This only lasted for about 4 weeks.  He feels this may be due to the fact that he did a lot of walking on 2 separate trips fairly soon after the injection.  Denies any new injury.  Has fairly diffuse knee pain with no new groin pain that is worse with weightbearing.  Has typical low back pain but no radicular pain..                ROS: All systems reviewed are negative as they relate to the chief complaint within the history of present illness.  Patient denies fevers or chills.  Assessment & Plan: Visit Diagnoses:  1. Primary osteoarthritis of both knees     Plan: Patient is a 53 year old male who presents for evaluation of right knee pain.  Has history of right knee osteoarthritis.  Last injection by Dr. Addie several months ago gave him good relief for about 4 weeks but he was very active at that time so did not last as long as it normally does.  He would like to try repeat injection and aspiration today.  Right knee was aspirated of about 40 cc of nonpurulent synovial fluid and injected with cortisone.  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  Follow-up after gel injection  approval.  This patient is diagnosed with osteoarthritis of the knee(s).    Radiographs show evidence of joint space narrowing, osteophytes, subchondral sclerosis and/or subchondral cysts.  This patient has knee pain which interferes with functional and activities of daily living.    This patient has experienced inadequate response, adverse effects and/or intolerance with conservative treatments such as acetaminophen , NSAIDS, topical creams, physical therapy or regular exercise, knee bracing and/or weight loss.   This patient has experienced inadequate response or has a contraindication to intra articular steroid injections for at least 3 months.   This patient is not scheduled to have a total knee replacement within 6 months of starting treatment with viscosupplementation.   Follow-Up Instructions: Return if symptoms worsen or fail to improve.   Orders:  No orders of the defined types were placed in this encounter.  No orders of the defined types were placed in this encounter.     Procedures: Large Joint Inj: R knee on 05/09/2023 2:01 PM Indications: diagnostic evaluation, joint swelling and pain Details: 18 G 1.5 in needle, superolateral approach  Arthrogram: No  Medications: 5 mL lidocaine  1 %; 40 mg methylPREDNISolone  acetate 40 MG/ML; 4 mL bupivacaine  0.25 % Aspirate:  40 mL Outcome: tolerated well, no immediate complications Procedure, treatment alternatives, risks and benefits explained, specific risks discussed. Consent was given by the patient. Immediately prior to procedure a time out was called to verify the correct patient, procedure, equipment, support staff and site/side marked as required. Patient was prepped and draped in the usual sterile fashion.       Clinical Data: No additional findings.  Objective: Vital Signs: Ht 6' 5.75 (1.975 m)   Wt 260 lb (117.9 kg)   BMI 30.24 kg/m   Physical Exam:  Constitutional: Patient appears well-developed HEENT:   Head: Normocephalic Eyes:EOM are normal Neck: Normal range of motion Cardiovascular: Normal rate Pulmonary/chest: Effort normal Neurologic: Patient is alert Skin: Skin is warm Psychiatric: Patient has normal mood and affect  Ortho Exam: Ortho exam demonstrates knees without cellulitis or skin changes.  Effusion present.  No calf tenderness.  Negative Homans' sign.  No pain with hip range of motion.  Able to perform straight leg raise with both lower extremities.  Lower extremities warm and well-perfused.  Tender over the medial joint line and mildly over the lateral joint line.  Has range of motion from about 5 degrees extension to 95 degrees of knee flexion.  Specialty Comments:  No specialty comments available.  Imaging: No results found.   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  Past Surgical History:  Procedure Laterality Date   INGUINAL HERNIA REPAIR Right 06/25/2022   Procedure: OPEN RIGHT INGUINAL HERNIA REPAIR;  Surgeon: Signe Mitzie LABOR, MD;  Location: [facility];  Service: General;  Laterality: Right;   KNEE SURGERY Bilateral    THYROIDECTOMY N/A 01/02/2020   Procedure: TOTAL THYROIDECTOMY WITH LIMITED LYMPH NODE DISSECTION;  Surgeon: Eletha Boas, MD;  Location: [facility];  Service: General;  Laterality: N/A;   VASECTOMY     WISDOM TOOTH EXTRACTION     Social History   Occupational History   Not on file  Tobacco Use   Smoking status: Never   Smokeless tobacco: Never  Vaping Use   Vaping status: Never Used  Substance and Sexual Activity   Alcohol use: Yes    Alcohol/week: 9.0 standard drinks of alcohol    Types: 9 Cans of beer per week    Comment: 3 days a week   Drug use: Never   Sexual activity: Not on file

## 2023-05-09 NOTE — Telephone Encounter (Signed)
 Please obtain authorization for right knee gel injection -Ricardo House

## 2023-05-18 ENCOUNTER — Ambulatory Visit: Payer: BC Managed Care – PPO | Admitting: Orthopedic Surgery

## 2023-05-20 NOTE — Telephone Encounter (Signed)
VOB submitted for Orthovisc, right knee

## 2023-05-27 DIAGNOSIS — Z23 Encounter for immunization: Secondary | ICD-10-CM | POA: Diagnosis not present

## 2023-06-03 ENCOUNTER — Other Ambulatory Visit (HOSPITAL_BASED_OUTPATIENT_CLINIC_OR_DEPARTMENT_OTHER): Payer: Self-pay

## 2023-07-07 ENCOUNTER — Other Ambulatory Visit (HOSPITAL_BASED_OUTPATIENT_CLINIC_OR_DEPARTMENT_OTHER): Payer: Self-pay

## 2023-07-08 ENCOUNTER — Other Ambulatory Visit: Payer: Self-pay

## 2023-07-08 ENCOUNTER — Other Ambulatory Visit (HOSPITAL_BASED_OUTPATIENT_CLINIC_OR_DEPARTMENT_OTHER): Payer: Self-pay

## 2023-08-24 ENCOUNTER — Encounter: Payer: Self-pay | Admitting: Surgical

## 2023-08-24 ENCOUNTER — Ambulatory Visit (INDEPENDENT_AMBULATORY_CARE_PROVIDER_SITE_OTHER): Admitting: Surgical

## 2023-08-24 DIAGNOSIS — M170 Bilateral primary osteoarthritis of knee: Secondary | ICD-10-CM

## 2023-08-24 DIAGNOSIS — M1711 Unilateral primary osteoarthritis, right knee: Secondary | ICD-10-CM | POA: Diagnosis not present

## 2023-08-24 MED ORDER — BUPIVACAINE HCL 0.25 % IJ SOLN
4.0000 mL | INTRAMUSCULAR | Status: AC | PRN
  Administered 2023-08-24: 4 mL via INTRA_ARTICULAR

## 2023-08-24 MED ORDER — LIDOCAINE HCL 1 % IJ SOLN
5.0000 mL | INTRAMUSCULAR | Status: AC | PRN
  Administered 2023-08-24: 5 mL

## 2023-08-24 MED ORDER — TRIAMCINOLONE ACETONIDE 40 MG/ML IJ SUSP
40.0000 mg | INTRAMUSCULAR | Status: AC | PRN
  Administered 2023-08-24: 40 mg via INTRA_ARTICULAR

## 2023-08-24 NOTE — Progress Notes (Signed)
 Office Visit Note   [name]           Date of Birth: [birth year]           MRN: [id number] Visit Date: 08/24/2023 Requested by: Benedetto Brady, MD [address] [place],  [place] [address] PCP: Benedetto Brady, MD  Subjective: No chief complaint on file.   HPI: Ricardo House is a 53 y.o. male who presents to the office reporting knee pain.  Has history of knee arthritis.  No new falls or injuries.  No fevers or chills.  Previous injections have provided good relief and they are here today to repeat injections.  Last injection back in January 2025 provided good relief..                ROS: All systems reviewed are negative as they relate to the chief complaint within the history of present illness.  Patient denies fevers or chills.  Assessment & Plan: Visit Diagnoses:  1. Primary osteoarthritis of both knees     Plan: Patient is a 53 year old male who presents for repeat aspiration injection.  Last aspiration injection in the right knee was on 05/09/2023.  He has return of fluid that he feels it is limiting primarily his flexion.  Right knee aspirated of 35 cc of nonpurulent synovial fluid and injected with cortisone.  Gel injection still pending.  Follow-up as needed with next possible injection in 3 to 4 months.  Follow-Up Instructions: No follow-ups on file.   Orders:  No orders of the defined types were placed in this encounter.  No orders of the defined types were placed in this encounter.     Procedures: Large Joint Inj: R knee on 08/24/2023 9:24 AM Indications: diagnostic evaluation, joint swelling and pain Details: 18 G 1.5 in needle, superolateral approach  Arthrogram: No  Medications: 5 mL lidocaine  1 %; 4 mL bupivacaine  0.25 %; 40 mg triamcinolone acetonide 40 MG/ML Aspirate: 35 mL Outcome: tolerated well, no immediate complications Procedure, treatment alternatives, risks and benefits explained, specific risks discussed. Consent was given by the  patient. Immediately prior to procedure a time out was called to verify the correct patient, procedure, equipment, support staff and site/side marked as required. Patient was prepped and draped in the usual sterile fashion.       Clinical Data: No additional findings.  Objective: Vital Signs: There were no vitals taken for this visit.  Physical Exam:  Constitutional: Patient appears well-developed HEENT:  Head: Normocephalic Eyes:EOM are normal Neck: Normal range of motion Cardiovascular: Normal rate Pulmonary/chest: Effort normal Neurologic: Patient is alert Skin: Skin is warm Psychiatric: Patient has normal mood and affect  Ortho Exam: Ortho exam demonstrates knees without cellulitis or skin changes.  Effusion present.  No calf tenderness.  Negative Homans' sign.  No pain with hip range of motion.  Able to perform straight leg raise with both lower extremities.  Lower extremities warm and well-perfused.  Tenderness primarily over the medial joint line.  Right knee with 110 degrees of knee flexion and 5 degrees of knee extension.  Specialty Comments:  No specialty comments available.  Imaging: No results found.   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  Past Surgical History:  Procedure Laterality Date   INGUINAL HERNIA REPAIR Right 06/25/2022  Procedure: OPEN RIGHT INGUINAL HERNIA REPAIR;  Surgeon: Adalberto Acton, MD;  Location: [facility];  Service: General;  Laterality: Right;   KNEE SURGERY Bilateral    THYROIDECTOMY N/A 01/02/2020   Procedure: TOTAL THYROIDECTOMY WITH LIMITED LYMPH NODE DISSECTION;  Surgeon: Oralee Billow, MD;  Location: [facility];  Service: General;  Laterality: N/A;   VASECTOMY     WISDOM TOOTH EXTRACTION     Social History   Occupational History   Not on file  Tobacco Use    Smoking status: Never   Smokeless tobacco: Never  Vaping Use   Vaping status: Never Used  Substance and Sexual Activity   Alcohol use: Yes    Alcohol/week: 9.0 standard drinks of alcohol    Types: 9 Cans of beer per week    Comment: 3 days a week   Drug use: Never   Sexual activity: Not on file

## 2023-10-05 ENCOUNTER — Other Ambulatory Visit (HOSPITAL_BASED_OUTPATIENT_CLINIC_OR_DEPARTMENT_OTHER): Payer: Self-pay

## 2023-10-06 ENCOUNTER — Other Ambulatory Visit (HOSPITAL_BASED_OUTPATIENT_CLINIC_OR_DEPARTMENT_OTHER): Payer: Self-pay

## 2023-10-06 MED ORDER — CYCLOBENZAPRINE HCL 5 MG PO TABS
5.0000 mg | ORAL_TABLET | Freq: Three times a day (TID) | ORAL | 0 refills | Status: DC | PRN
  Filled 2023-10-06: qty 20, 7d supply, fill #0

## 2023-10-06 MED ORDER — LEVOTHYROXINE SODIUM 175 MCG PO TABS
175.0000 ug | ORAL_TABLET | Freq: Every day | ORAL | 0 refills | Status: DC
  Filled 2023-10-06: qty 30, 30d supply, fill #0

## 2023-11-01 DIAGNOSIS — Z9889 Other specified postprocedural states: Secondary | ICD-10-CM | POA: Diagnosis not present

## 2023-11-01 DIAGNOSIS — Z85850 Personal history of malignant neoplasm of thyroid: Secondary | ICD-10-CM | POA: Diagnosis not present

## 2023-11-01 DIAGNOSIS — E890 Postprocedural hypothyroidism: Secondary | ICD-10-CM | POA: Diagnosis not present

## 2023-11-10 ENCOUNTER — Other Ambulatory Visit (HOSPITAL_BASED_OUTPATIENT_CLINIC_OR_DEPARTMENT_OTHER): Payer: Self-pay

## 2023-11-10 MED ORDER — LEVOTHYROXINE SODIUM 175 MCG PO TABS
175.0000 ug | ORAL_TABLET | Freq: Every day | ORAL | 4 refills | Status: AC
  Filled 2023-11-10: qty 90, 90d supply, fill #0
  Filled 2024-02-09: qty 90, 90d supply, fill #1
  Filled 2024-05-11: qty 30, 30d supply, fill #2

## 2024-01-02 DIAGNOSIS — Z130 Encounter for screening for diseases of the blood and blood-forming organs and certain disorders involving the immune mechanism: Secondary | ICD-10-CM | POA: Diagnosis not present

## 2024-01-02 DIAGNOSIS — Z13220 Encounter for screening for lipoid disorders: Secondary | ICD-10-CM | POA: Diagnosis not present

## 2024-01-03 DIAGNOSIS — Z130 Encounter for screening for diseases of the blood and blood-forming organs and certain disorders involving the immune mechanism: Secondary | ICD-10-CM | POA: Diagnosis not present

## 2024-01-03 DIAGNOSIS — Z0189 Encounter for other specified special examinations: Secondary | ICD-10-CM | POA: Diagnosis not present

## 2024-01-03 DIAGNOSIS — R208 Other disturbances of skin sensation: Secondary | ICD-10-CM | POA: Diagnosis not present

## 2024-01-03 DIAGNOSIS — Z13220 Encounter for screening for lipoid disorders: Secondary | ICD-10-CM | POA: Diagnosis not present

## 2024-01-19 ENCOUNTER — Telehealth: Payer: Self-pay | Admitting: Radiology

## 2024-01-19 ENCOUNTER — Other Ambulatory Visit: Payer: Self-pay

## 2024-01-19 ENCOUNTER — Other Ambulatory Visit (HOSPITAL_BASED_OUTPATIENT_CLINIC_OR_DEPARTMENT_OTHER): Payer: Self-pay

## 2024-01-19 DIAGNOSIS — M170 Bilateral primary osteoarthritis of knee: Secondary | ICD-10-CM

## 2024-01-19 MED ORDER — CYCLOBENZAPRINE HCL 5 MG PO TABS
5.0000 mg | ORAL_TABLET | Freq: Three times a day (TID) | ORAL | 0 refills | Status: DC | PRN
  Filled 2024-01-19: qty 20, 7d supply, fill #0

## 2024-01-19 NOTE — Telephone Encounter (Signed)
 noted

## 2024-01-19 NOTE — Telephone Encounter (Signed)
 Patient is seeing Herlene for right knee aspiration tomorrow. Per his last office note in April, he was pending gel injection approval. Can you tell me if we got that, and if so, is it possible to do tomorrow if Herlene wants?

## 2024-01-19 NOTE — Telephone Encounter (Signed)
 SABRA

## 2024-01-19 NOTE — Telephone Encounter (Signed)
 YesSABRA Primes can do tomorrow if patient would like.  It will be for a series.  Putting in referral.

## 2024-01-20 ENCOUNTER — Ambulatory Visit: Admitting: Surgical

## 2024-01-20 DIAGNOSIS — M1711 Unilateral primary osteoarthritis, right knee: Secondary | ICD-10-CM | POA: Diagnosis not present

## 2024-01-20 DIAGNOSIS — M170 Bilateral primary osteoarthritis of knee: Secondary | ICD-10-CM

## 2024-01-21 ENCOUNTER — Encounter: Payer: Self-pay | Admitting: Surgical

## 2024-01-21 MED ORDER — HYALURONAN 30 MG/2ML IX SOSY
30.0000 mg | PREFILLED_SYRINGE | INTRA_ARTICULAR | Status: AC | PRN
  Administered 2024-01-20: 30 mg via INTRA_ARTICULAR

## 2024-01-21 MED ORDER — LIDOCAINE HCL 1 % IJ SOLN
5.0000 mL | INTRAMUSCULAR | Status: AC | PRN
  Administered 2024-01-20: 5 mL

## 2024-01-21 NOTE — Progress Notes (Cosign Needed Addendum)
   Procedure Note  Patient: Dondre Catalfamo             Date of Birth: 1970-06-02           MRN: [id number]             Visit Date: 01/20/2024  Procedures: Visit Diagnoses:  1. Primary osteoarthritis of both knees     Large Joint Inj: R knee on 01/20/2024 12:27 PM Indications: diagnostic evaluation, joint swelling and pain Details: 18 G 1.5 in needle, superolateral approach  Arthrogram: No  Medications: 5 mL lidocaine  1 %; 30 mg Hyaluronan 30 MG/2ML Aspirate: 35 mL Outcome: tolerated well, no immediate complications Procedure, treatment alternatives, risks and benefits explained, specific risks discussed. Consent was given by the patient. Immediately prior to procedure a time out was called to verify the correct patient, procedure, equipment, support staff and site/side marked as required. Patient was prepped and draped in the usual sterile fashion.

## 2024-01-27 ENCOUNTER — Ambulatory Visit: Admitting: Surgical

## 2024-01-27 DIAGNOSIS — M1711 Unilateral primary osteoarthritis, right knee: Secondary | ICD-10-CM | POA: Diagnosis not present

## 2024-01-27 DIAGNOSIS — M170 Bilateral primary osteoarthritis of knee: Secondary | ICD-10-CM

## 2024-01-29 ENCOUNTER — Encounter: Payer: Self-pay | Admitting: Surgical

## 2024-01-29 MED ORDER — HYALURONAN 30 MG/2ML IX SOSY
30.0000 mg | PREFILLED_SYRINGE | INTRA_ARTICULAR | Status: AC | PRN
  Administered 2024-01-27: 30 mg via INTRA_ARTICULAR

## 2024-01-29 MED ORDER — LIDOCAINE HCL 1 % IJ SOLN
5.0000 mL | INTRAMUSCULAR | Status: AC | PRN
  Administered 2024-01-27: 5 mL

## 2024-01-29 NOTE — Progress Notes (Signed)
   Procedure Note  [name]             Date of Birth: 1971/02/08           MRN: [id number]             Visit Date: 01/27/2024  Procedures: Visit Diagnoses:  1. Primary osteoarthritis of both knees     Large Joint Inj: R knee on 01/27/2024 9:42 AM Indications: diagnostic evaluation, joint swelling and pain Details: 18 G 1.5 in needle, superolateral approach  Arthrogram: No  Medications: 5 mL lidocaine  1 %; 30 mg Hyaluronan 30 MG/2ML Aspirate: 31 mL Outcome: tolerated well, no immediate complications Procedure, treatment alternatives, risks and benefits explained, specific risks discussed. Consent was given by the patient. Immediately prior to procedure a time out was called to verify the correct patient, procedure, equipment, support staff and site/side marked as required. Patient was prepped and draped in the usual sterile fashion.

## 2024-02-06 ENCOUNTER — Ambulatory Visit: Admitting: Surgical

## 2024-02-06 ENCOUNTER — Encounter: Payer: Self-pay | Admitting: Surgical

## 2024-02-06 DIAGNOSIS — M1711 Unilateral primary osteoarthritis, right knee: Secondary | ICD-10-CM | POA: Diagnosis not present

## 2024-02-06 DIAGNOSIS — M170 Bilateral primary osteoarthritis of knee: Secondary | ICD-10-CM

## 2024-02-06 MED ORDER — LIDOCAINE HCL 1 % IJ SOLN
5.0000 mL | INTRAMUSCULAR | Status: AC | PRN
  Administered 2024-02-06: 5 mL

## 2024-02-06 MED ORDER — HYALURONAN 30 MG/2ML IX SOSY
30.0000 mg | PREFILLED_SYRINGE | INTRA_ARTICULAR | Status: AC | PRN
  Administered 2024-02-06: 30 mg via INTRA_ARTICULAR

## 2024-02-06 NOTE — Progress Notes (Signed)
   Procedure Note  [name]             Date of Birth: Jan 18, 1971           MRN: [id number]             Visit Date: 02/06/2024  Procedures: Visit Diagnoses:  1. Primary osteoarthritis of both knees     Large Joint Inj: R knee on 02/06/2024 2:49 PM Indications: diagnostic evaluation, joint swelling and pain Details: 18 G 1.5 in needle, superolateral approach  Arthrogram: No  Medications: 5 mL lidocaine  1 %; 30 mg Hyaluronan 30 MG/2ML Aspirate: 50 mL Outcome: tolerated well, no immediate complications Procedure, treatment alternatives, risks and benefits explained, specific risks discussed. Consent was given by the patient. Immediately prior to procedure a time out was called to verify the correct patient, procedure, equipment, support staff and site/side marked as required. Patient was prepped and draped in the usual sterile fashion.

## 2024-02-09 ENCOUNTER — Other Ambulatory Visit: Payer: Self-pay

## 2024-02-27 ENCOUNTER — Encounter: Payer: Self-pay | Admitting: Radiology

## 2024-05-11 ENCOUNTER — Other Ambulatory Visit: Payer: Self-pay

## 2024-05-11 ENCOUNTER — Other Ambulatory Visit (HOSPITAL_BASED_OUTPATIENT_CLINIC_OR_DEPARTMENT_OTHER): Payer: Self-pay

## 2024-05-29 ENCOUNTER — Other Ambulatory Visit (HOSPITAL_BASED_OUTPATIENT_CLINIC_OR_DEPARTMENT_OTHER): Payer: Self-pay

## 2024-05-29 MED ORDER — CYCLOBENZAPRINE HCL 5 MG PO TABS
5.0000 mg | ORAL_TABLET | Freq: Three times a day (TID) | ORAL | 0 refills | Status: AC | PRN
  Filled 2024-05-29: qty 20, 7d supply, fill #0
